# Patient Record
Sex: Male | Born: 1950
Health system: Southern US, Community
[De-identification: ages and names within clinical notes are randomized; demographics above are authoritative.]

## PROBLEM LIST (undated history)

## (undated) ENCOUNTER — Emergency Department (HOSPITAL_COMMUNITY): Admission: EM | Discharge status: Against Medical Advice | Payer: Self-pay

## (undated) DIAGNOSIS — I10 Essential (primary) hypertension: Secondary | ICD-10-CM

## (undated) HISTORY — DX: Essential (primary) hypertension: I10

---

## 2008-05-28 ENCOUNTER — Emergency Department: Payer: Self-pay | Admitting: Emergency Medicine

## 2011-03-14 ENCOUNTER — Emergency Department: Payer: Self-pay | Admitting: Unknown Physician Specialty

## 2014-03-11 ENCOUNTER — Emergency Department: Payer: Self-pay | Admitting: Emergency Medicine

## 2015-10-31 IMAGING — CT CT CERVICAL SPINE WITHOUT CONTRAST
3 of 4 series · 14 of 33 positions shown, 17 images · non-contrast
Comparison: None.

CLINICAL DATA: Fall.  Neck pain

EXAM:
CT CERVICAL SPINE WITHOUT CONTRAST
TECHNIQUE: Multidetector CT imaging of the cervical spine was performed without
intravenous contrast. Multiplanar CT image reconstructions were also
generated.

[Series 3: sag bone · sagittal · 0.31mm/px · 5 of 75 slices shown, 6 images]
[im 25/75  bone]
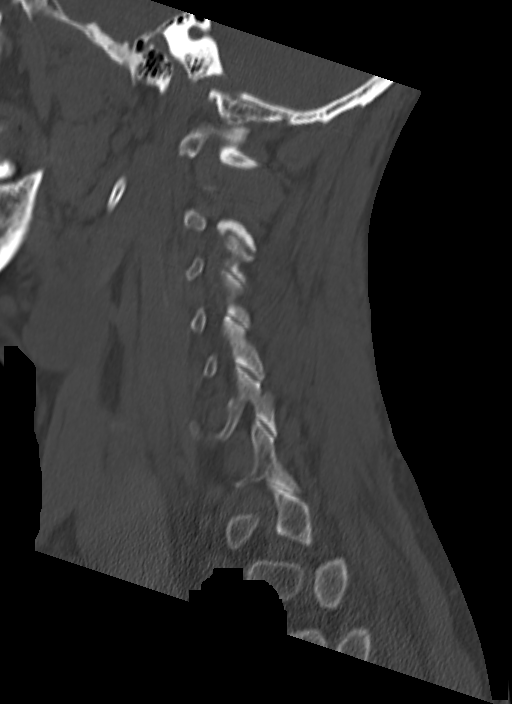
[im 31/75  bone]
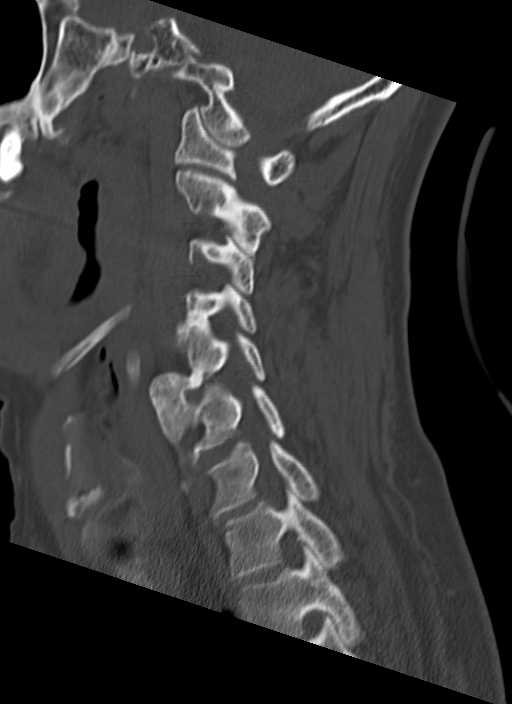
[im 38/75  soft-tissue]
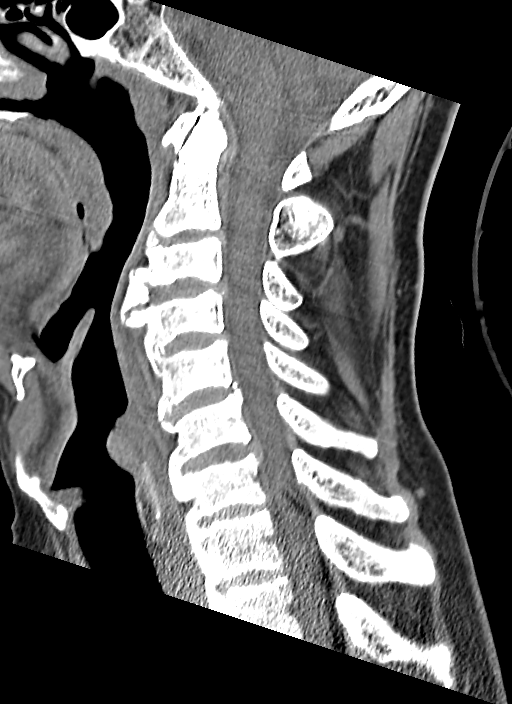
[im 38/75  bone]
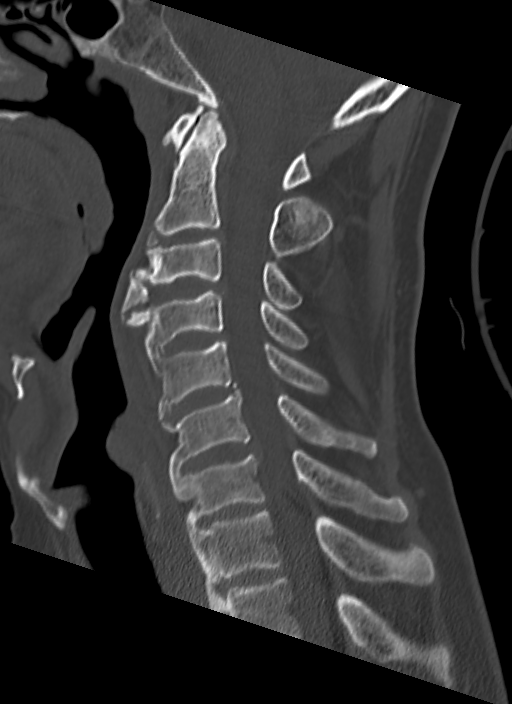
[im 44/75  bone]
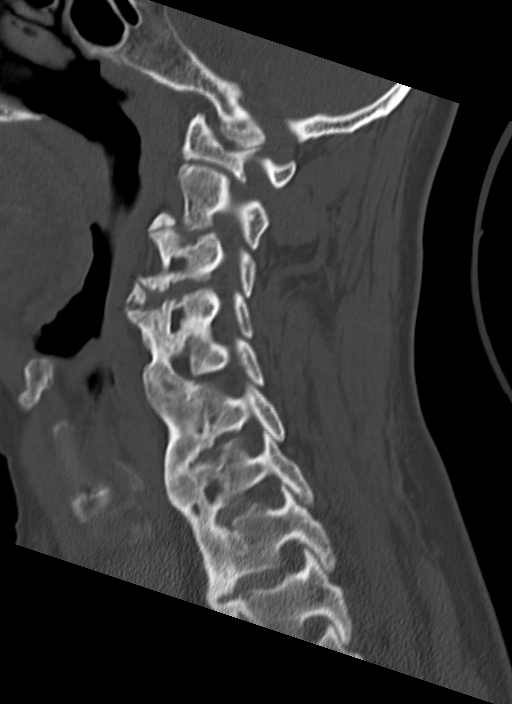
[im 50/75  bone]
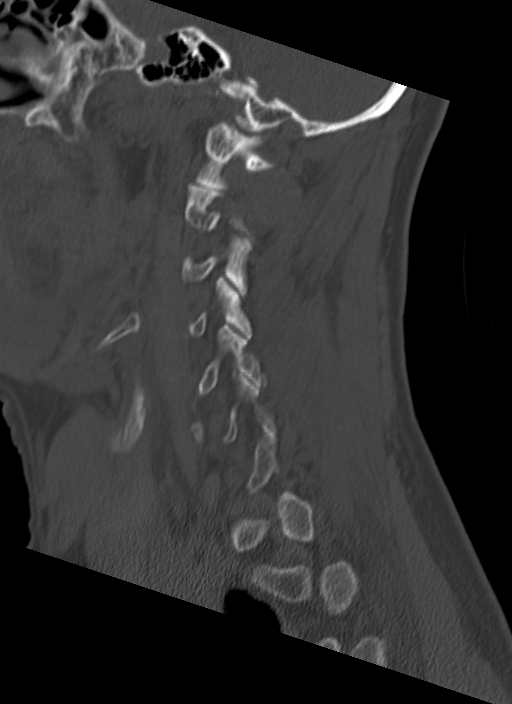

[Series 4: cor bone · coronal · 0.32mm/px · 3 of 68 slices shown]
[im 17/68  bone]
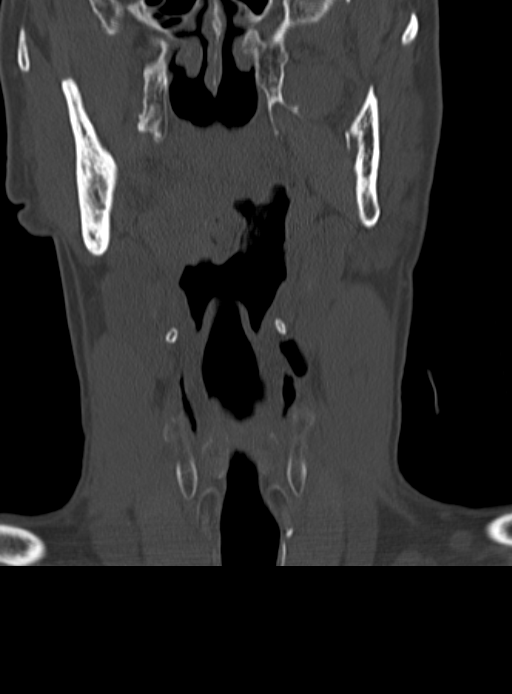
[im 28/68  bone]
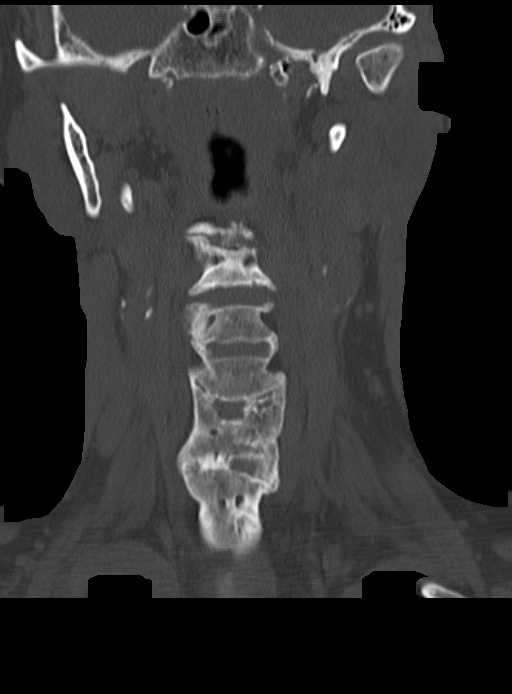
[im 40/68  bone]
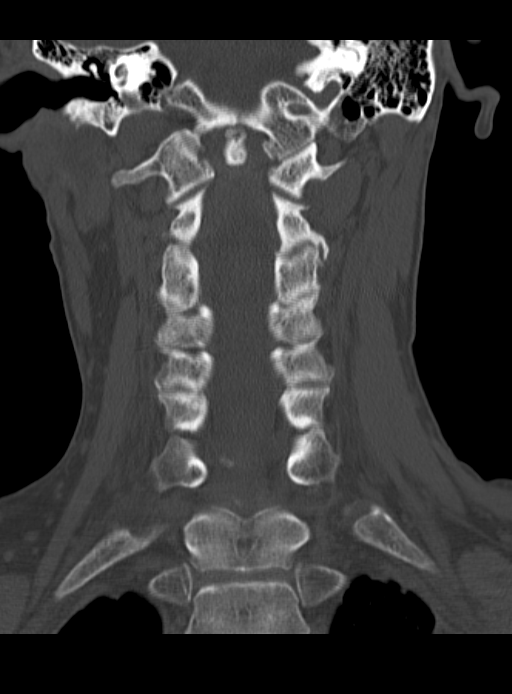

[Series 5: orthogonal axials · axial · 0.29mm/px · z∈[+196,+334]mm · 6 of 109 slices shown, 8 images]
[im 16/109  soft-tissue]
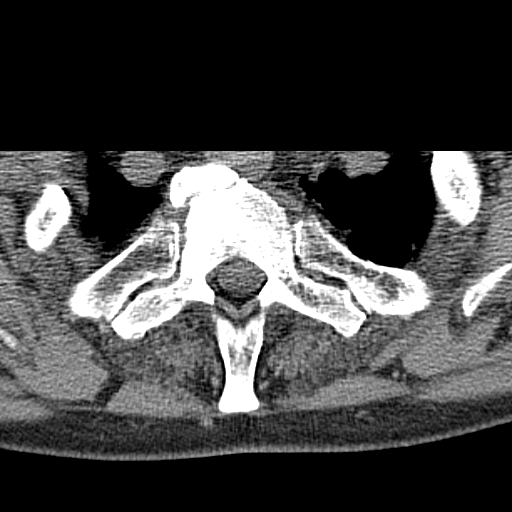
[im 16/109  bone]
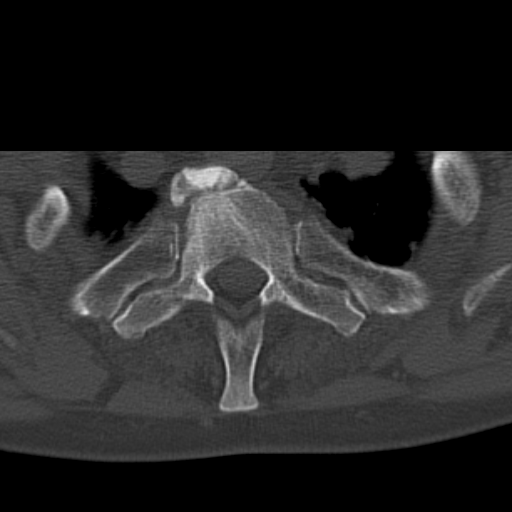
[im 31/109  bone]
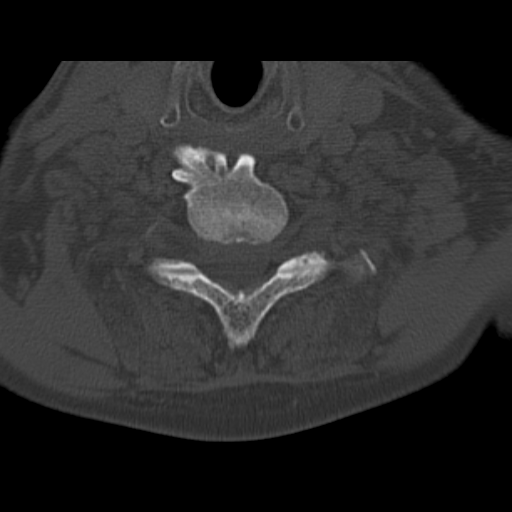
[im 47/109  bone]
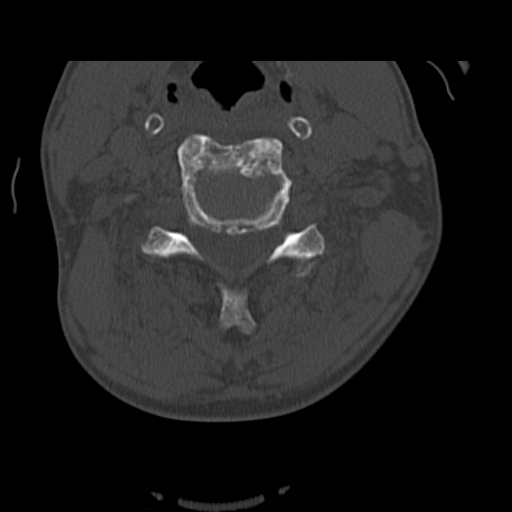
[im 62/109  bone]
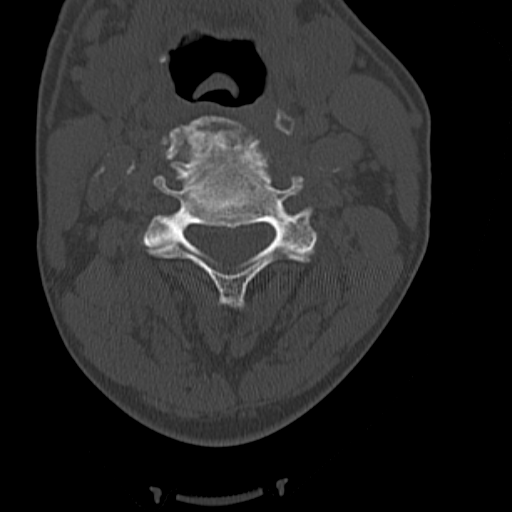
[im 78/109  soft-tissue]
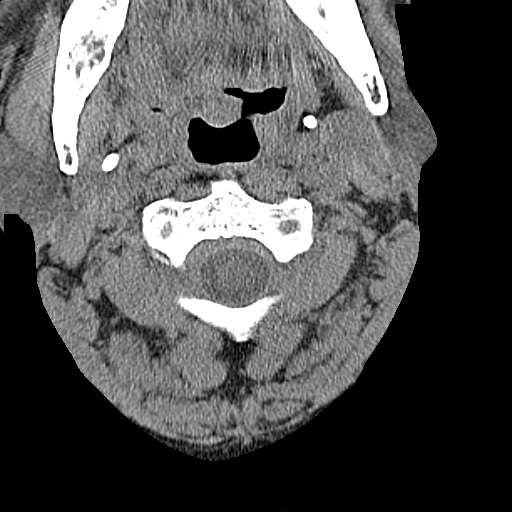
[im 78/109  bone]
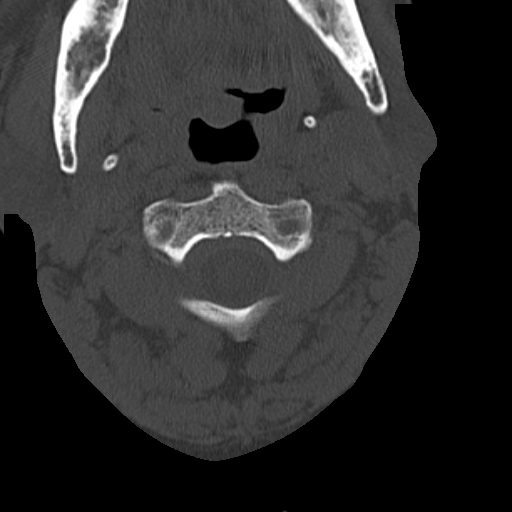
[im 93/109  bone]
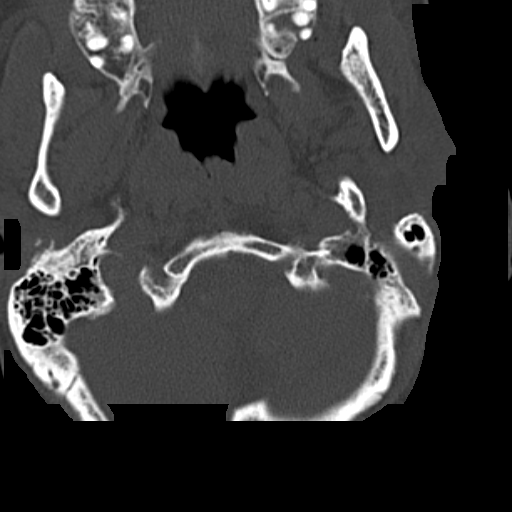

[14 of 33 positions shown; findings below may reference images not displayed]

FINDINGS: Normal cervical alignment. Negative for fracture. Mild disc
degeneration and spondylosis is present at C5-6 and C6-7. Prominent
anterior flowing osteophytes are present throughout the cervical
spine.

Negative for mass lesion.
IMPRESSION: Negative for fracture.

## 2018-01-13 ENCOUNTER — Encounter: Payer: Self-pay | Admitting: Family Medicine

## 2018-01-13 ENCOUNTER — Ambulatory Visit (INDEPENDENT_AMBULATORY_CARE_PROVIDER_SITE_OTHER): Payer: Medicare Other | Admitting: Family Medicine

## 2018-01-13 VITALS — BP 194/92 | HR 72 | Temp 98.0°F | Wt 160.4 lb

## 2018-01-13 DIAGNOSIS — I10 Essential (primary) hypertension: Secondary | ICD-10-CM | POA: Diagnosis not present

## 2018-01-13 DIAGNOSIS — Z7689 Persons encountering health services in other specified circumstances: Secondary | ICD-10-CM | POA: Diagnosis not present

## 2018-01-13 MED ORDER — AMLODIPINE BESYLATE 10 MG PO TABS: 10.0000 mg | ORAL_TABLET | Freq: Every day | ORAL | 0 refills | Status: DC

## 2018-01-13 NOTE — Progress Notes (Signed)
BP (!) 194/92   Pulse 72   Temp 98 F (36.7 C) (Oral)   Wt 160 lb 6.4 oz (72.8 kg)   SpO2 97%    Subjective:    Patient ID: Robert Larsen, male    DOB: 05-Jan-1951, 67 y.o.   MRN: [id number]  HPI: Robert Larsen is a 67 y.o. male  Chief Complaint  Patient presents with  . Establish Care  . Hypertension    Daughter states patient can't take lisinopril, all family members have had a severe reaction to the medication.  . Mass    Daugter states here's a knot on the left side of his chest.    Pt here today to establish care. No known medical problems other than hypertension. Was given BP medication in the hopsital several years ago which helped quite a bit. Either atenolol or norvasc couldn't remember. Ran out of the medicine and has not sought care since. Has headaches nearly every day and notes readings at home in the high 190s/high 90s-100s. Denies CP, SOB, dizziness, visual disturbances. Eats a healthy diet and stays active.   Does smoke cigarettes, about 2-3 cigarettes per day - not interested in quitting.   No other concerns today, not currently on any medications. Does not recall last CPE but states it's been years.   Past Medical History:  Diagnosis Date  . Hypertension    Social History   Socioeconomic History  . Marital status: Single    Spouse name: Not on file  . Number of children: Not on file  . Years of education: Not on file  . Highest education level: Not on file  Social Needs  . Financial resource strain: Not on file  . Food insecurity - worry: Not on file  . Food insecurity - inability: Not on file  . Transportation needs - medical: Not on file  . Transportation needs - non-medical: Not on file  Occupational History  . Not on file  Tobacco Use  . Smoking status: Current Every Day Smoker  . Smokeless tobacco: Never Used  Substance and Sexual Activity  . Alcohol use: Yes    Alcohol/week: 16.8 oz    Types: 28 Cans of beer per week    Comment: 4  cans beer/day  . Drug use: No  . Sexual activity: Not on file  Other Topics Concern  . Not on file  Social History Narrative  . Not on file    Relevant past medical, surgical, family and social history reviewed and updated as indicated. Interim medical history since our last visit reviewed. Allergies and medications reviewed and updated.  Review of Systems  Constitutional: Negative.   HENT: Negative.   Respiratory: Negative.   Cardiovascular: Negative.   Gastrointestinal: Negative.   Genitourinary: Negative.   Musculoskeletal: Negative.   Neurological: Positive for headaches.  Psychiatric/Behavioral: Negative.    Per HPI unless specifically indicated above     Objective:    BP (!) 194/92   Pulse 72   Temp 98 F (36.7 C) (Oral)   Wt 160 lb 6.4 oz (72.8 kg)   SpO2 97%   Wt Readings from Last 3 Encounters:  01/13/18 160 lb 6.4 oz (72.8 kg)    Physical Exam  Constitutional: He is oriented to person, place, and time. He appears well-developed and well-nourished. No distress.  HENT:  Head: Atraumatic.  Eyes: Conjunctivae are normal. Pupils are equal, round, and reactive to light.  Neck: Normal range of motion. Neck supple.  Cardiovascular: Normal  rate and normal heart sounds.  Pulmonary/Chest: Effort normal and breath sounds normal.  Musculoskeletal: Normal range of motion.  Neurological: He is alert and oriented to person, place, and time.  Skin: Skin is warm and dry.  Psychiatric: He has a normal mood and affect. His behavior is normal.  Nursing note and vitals reviewed.  No results found for this or any previous visit.    Assessment & Plan:   Problem List Items Addressed This Visit      Cardiovascular and Mediastinum   Essential hypertension - Primary    Will start 10 mg amlodipine and continue monitoring home BPs and logging readings/sxs. Will recheck in 1 month. Call with side effects or persistent abnormal readings in meantime.       Relevant Medications     amLODipine (NORVASC) 10 MG tablet    Other Visit Diagnoses    Encounter to establish care       Overdue for CPE and AWV, will schedule both during 1 month BP f/u       Follow up plan: Return in about 4 weeks (around 02/10/2018) for CPE, AWV, BP f/u.

## 2018-01-14 DIAGNOSIS — I10 Essential (primary) hypertension: Secondary | ICD-10-CM | POA: Insufficient documentation

## 2018-01-14 NOTE — Assessment & Plan Note (Signed)
Will start 10 mg amlodipine and continue monitoring home BPs and logging readings/sxs. Will recheck in 1 month. Call with side effects or persistent abnormal readings in meantime.

## 2018-01-14 NOTE — Patient Instructions (Signed)
Follow up in 1 month   

## 2018-01-15 ENCOUNTER — Ambulatory Visit: Payer: Self-pay | Admitting: *Deleted

## 2018-01-15 NOTE — Telephone Encounter (Signed)
Pt's daughter reports pt seen 01/13/18 for HTN, placed on Norvasc 10 mg which he has taken each am. Daughter reports B/P Monday 180/80  HR 77.Marland Kitchen.Marland Kitchen.Marland Kitchen.Tues 160/85  HR  79;  This afternoon B/P  left arm... 177/100  HR 90 , right arm (manually checked) 185/89   HR 85.  Pt reports saw "a couple spots" this afternoon. Denies headache, dizziness, no present visual changes. Pt's daughter reports he has been working nights which may contribute to pressure. Pt resting presently.Pt's daughter is a Engineer, civil (consulting)nurse, asking if dose should be increased and if so could that be called in to pharmacy. Note routed to PCP.   Reason for Disposition . Systolic BP  >= 180 OR Diastolic >= 110  Answer Assessment - Initial Assessment Questions 1. BLOOD PRESSURE: "What is the blood pressure?" "Did you take at least two measurements 5 minutes apart?"     Yes, by daughter who is a Engineer, civil (consulting)nurse. 177/100 left arm with home monitor HR     90      Manually 185/89 HR 85 2. ONSET: "When did you take your blood pressure?"     "10 minutes ago" 3. HOW: "How did you obtain the blood pressure?" (e.g., visiting nurse, automatic home BP monitor)     Both manually and monitor 4. HISTORY: "Do you have a history of high blood pressure?"     yes 5. MEDICATIONS: "Are you taking any medications for blood pressure?" "Have you missed any doses recently?"     Yes, no missed doses 6. OTHER SYMPTOMS: "Do you have any symptoms?" (e.g., headache, chest pain, blurred vision, difficulty breathing, weakness)     Reports saw " A couple spots this afternoon." Denies headache, dizziness, chest pain. No present visual changes.  Protocols used: HIGH BLOOD PRESSURE-A-AH

## 2018-01-16 NOTE — Telephone Encounter (Signed)
Patient notified

## 2018-01-16 NOTE — Telephone Encounter (Signed)
He only started the new medicine 2-3 days ago and we need to give it a bit of time to get into his system. I also don't want to drop his blood pressures too low too fast as he's been in the 190s/100 range for years now. He's got a follow up scheduled, we can re-assess at this point.

## 2018-01-20 ENCOUNTER — Ambulatory Visit: Payer: Self-pay

## 2018-01-20 MED ORDER — HYDROCHLOROTHIAZIDE 25 MG PO TABS: 25.0000 mg | ORAL_TABLET | Freq: Every day | ORAL | 0 refills | Status: DC

## 2018-01-20 NOTE — Telephone Encounter (Signed)
Pt's daughter (who is an Charity fundraiserN) calling to report elevated BP's with her father. Pt 's BP 199/90 and 180/90 HR 72. Daughter states father is c/o weakness. States he is not having weakness or numbness of the face, arm or leg on one side of the body. No arm drifting. No spots or blurred vision. Pt was started on amlodipine 01/13/18 and daughter has been logging BP's. Care advice given and appt made with PCP for tomorrow at 0930.  Reason for Disposition . Systolic BP  >= 180 OR Diastolic >= 110  Answer Assessment - Initial Assessment Questions 1. BLOOD PRESSURE: "What is the blood pressure?" "Did you take at least two measurements 5 minutes apart?"     199/93 and 180/90 HR 72 2. ONSET: "When did you take your blood pressure?"     1525 3. HOW: "How did you obtain the blood pressure?" (e.g., visiting nurse, automatic home BP monitor)     Manual BP cuff 4. HISTORY: "Do you have a history of high blood pressure?"     Yes  5. MEDICATIONS: "Are you taking any medications for blood pressure?" "Have you missed any doses recently?"     Yes new med that was 01/13/17 has not missed any doses 6. OTHER SYMPTOMS: "Do you have any symptoms?" (e.g., headache, chest pain, blurred vision, difficulty breathing, weakness)     Weakness feels tired- no neuro sx on either side of body no headache spots or blurred 7. PREGNANCY: "Is there any chance you are pregnant?" "When was your last menstrual period?"     n/a  Protocols used: HIGH BLOOD PRESSURE-A-AH

## 2018-01-20 NOTE — Telephone Encounter (Signed)
Called pt, who corroborated that his BPs have been still significantly elevated despite daily amlodipine 10 mg. Agreeable to adding 25 mg HCTZ and coming in for an OV in 1-2 weeks to check on things. Discussed possible side effects and to drink lots of fluids. Pt will start medication tomorrow.   Copied from CRM [phone]#[id number]. Topic: Inquiry >> Jan 20, 2018  3:21 PM Alexander BergeronBarksdale, Harvey B wrote: Reason for CRM: pt's child called and states the pt's blood pressure is highly elevated and is seeking guidance Left 218/96 Right 199/93

## 2018-01-21 ENCOUNTER — Ambulatory Visit: Payer: Medicare Other | Admitting: Family Medicine

## 2018-02-10 ENCOUNTER — Ambulatory Visit (INDEPENDENT_AMBULATORY_CARE_PROVIDER_SITE_OTHER): Payer: Medicare Other

## 2018-02-10 ENCOUNTER — Ambulatory Visit (INDEPENDENT_AMBULATORY_CARE_PROVIDER_SITE_OTHER): Payer: Medicare Other | Admitting: Family Medicine

## 2018-02-10 VITALS — BP 168/79 | HR 67 | Temp 97.7°F | Resp 16 | Ht 69.5 in | Wt 154.1 lb

## 2018-02-10 VITALS — BP 138/60 | HR 67 | Temp 97.7°F | Resp 16 | Ht 69.5 in | Wt 154.1 lb

## 2018-02-10 DIAGNOSIS — Z1159 Encounter for screening for other viral diseases: Secondary | ICD-10-CM

## 2018-02-10 DIAGNOSIS — Z0001 Encounter for general adult medical examination with abnormal findings: Secondary | ICD-10-CM

## 2018-02-10 DIAGNOSIS — A599 Trichomoniasis, unspecified: Secondary | ICD-10-CM

## 2018-02-10 DIAGNOSIS — Z0000 Encounter for general adult medical examination without abnormal findings: Secondary | ICD-10-CM | POA: Diagnosis not present

## 2018-02-10 DIAGNOSIS — I10 Essential (primary) hypertension: Secondary | ICD-10-CM

## 2018-02-10 LAB — URINALYSIS, ROUTINE W REFLEX MICROSCOPIC
Bilirubin, UA: NEGATIVE
Glucose, UA: NEGATIVE
Ketones, UA: NEGATIVE
NITRITE UA: NEGATIVE
PH UA: 7 (ref 5.0–7.5)
Protein, UA: NEGATIVE
RBC, UA: NEGATIVE
Specific Gravity, UA: 1.01 (ref 1.005–1.030)
UUROB: 0.2 mg/dL (ref 0.2–1.0)

## 2018-02-10 LAB — MICROSCOPIC EXAMINATION: Bacteria, UA: NONE SEEN

## 2018-02-10 MED ORDER — METOPROLOL SUCCINATE ER 50 MG PO TB24: 50.0000 mg | ORAL_TABLET | Freq: Every day | ORAL | 0 refills | Status: DC

## 2018-02-10 MED ORDER — METRONIDAZOLE 500 MG PO TABS: 500.0000 mg | ORAL_TABLET | Freq: Two times a day (BID) | ORAL | 0 refills | Status: DC

## 2018-02-10 MED ORDER — AMLODIPINE BESYLATE 10 MG PO TABS: 10.0000 mg | ORAL_TABLET | Freq: Every day | ORAL | 1 refills | Status: DC

## 2018-02-10 NOTE — Patient Instructions (Addendum)
Robert Larsen , Thank you for taking time to come for your Medicare Wellness Visit. I appreciate your ongoing commitment to your health goals. Please review the following plan we discussed and let me know if I can assist you in the future.   Screening recommendations/referrals: Colonoscopy: due now- declined Recommended yearly ophthalmology/optometry visit for glaucoma screening and checkup Recommended yearly dental visit for hygiene and checkup  Vaccinations: Influenza vaccine: due- declined Pneumococcal vaccine: due- declined Tdap vaccine: due-declined Shingles vaccine: due-declined  Advanced directives: Advance directive discussed with you today. I have provided a copy for you to complete at home and have notarized. Once this is complete please bring a copy in to our office so we can scan it into your chart.  Conditions/risks identified: Smoking cessation discussed  Next appointment: Follow up in one year for your annual wellness exam.   Preventive Care 65 Years and Older, Male Preventive care refers to lifestyle choices and visits with your health care provider that can promote health and wellness. What does preventive care include?  A yearly physical exam. This is also called an annual well check.  Dental exams once or twice a year.  Routine eye exams. Ask your health care provider how often you should have your eyes checked.  Personal lifestyle choices, including:  Daily care of your teeth and gums.  Regular physical activity.  Eating a healthy diet.  Avoiding tobacco and drug use.  Limiting alcohol use.  Practicing safe sex.  Taking low doses of aspirin every day.  Taking vitamin and mineral supplements as recommended by your health care provider. What happens during an annual well check? The services and screenings done by your health care provider during your annual well check will depend on your age, overall health, lifestyle risk factors, and family history of  disease. Counseling  Your health care provider may ask you questions about your:  Alcohol use.  Tobacco use.  Drug use.  Emotional well-being.  Home and relationship well-being.  Sexual activity.  Eating habits.  History of falls.  Memory and ability to understand (cognition).  Work and work Astronomer. Screening  You may have the following tests or measurements:  Height, weight, and BMI.  Blood pressure.  Lipid and cholesterol levels. These may be checked every 5 years, or more frequently if you are over 6 years old.  Skin check.  Lung cancer screening. You may have this screening every year starting at age 55 if you have a 30-pack-year history of smoking and currently smoke or have quit within the past 15 years.  Fecal occult blood test (FOBT) of the stool. You may have this test every year starting at age 1.  Flexible sigmoidoscopy or colonoscopy. You may have a sigmoidoscopy every 5 years or a colonoscopy every 10 years starting at age 40.  Prostate cancer screening. Recommendations will vary depending on your family history and other risks.  Hepatitis C blood test.  Hepatitis B blood test.  Sexually transmitted disease (STD) testing.  Diabetes screening. This is done by checking your blood sugar (glucose) after you have not eaten for a while (fasting). You may have this done every 1-3 years.  Abdominal aortic aneurysm (AAA) screening. You may need this if you are a current or former smoker.  Osteoporosis. You may be screened starting at age 99 if you are at high risk. Talk with your health care provider about your test results, treatment options, and if necessary, the need for more tests. Vaccines  Your  health care provider may recommend certain vaccines, such as:  Influenza vaccine. This is recommended every year.  Tetanus, diphtheria, and acellular pertussis (Tdap, Td) vaccine. You may need a Td booster every 10 years.  Zoster vaccine. You may  need this after age 61.  Pneumococcal 13-valent conjugate (PCV13) vaccine. One dose is recommended after age 47.  Pneumococcal polysaccharide (PPSV23) vaccine. One dose is recommended after age 73. Talk to your health care provider about which screenings and vaccines you need and how often you need them. This information is not intended to replace advice given to you by your health care provider. Make sure you discuss any questions you have with your health care provider. Document Released: 12/23/2015 Document Revised: 08/15/2016 Document Reviewed: 09/27/2015 Elsevier Interactive Patient Education  2017 [place] Prevention in the Home Falls can cause injuries. They can happen to people of all ages. There are many things you can do to make your home safe and to help prevent falls. What can I do on the outside of my home?  Regularly fix the edges of walkways and driveways and fix any cracks.  Remove anything that might make you trip as you walk through a door, such as a raised step or threshold.  Trim any bushes or trees on the path to your home.  Use bright outdoor lighting.  Clear any walking paths of anything that might make someone trip, such as rocks or tools.  Regularly check to see if handrails are loose or broken. Make sure that both sides of any steps have handrails.  Any raised decks and porches should have guardrails on the edges.  Have any leaves, snow, or ice cleared regularly.  Use sand or salt on walking paths during winter.  Clean up any spills in your garage right away. This includes oil or grease spills. What can I do in the bathroom?  Use night lights.  Install grab bars by the toilet and in the tub and shower. Do not use towel bars as grab bars.  Use non-skid mats or decals in the tub or shower.  If you need to sit down in the shower, use a plastic, non-slip stool.  Keep the floor dry. Clean up any water that spills on the floor as soon as it  happens.  Remove soap buildup in the tub or shower regularly.  Attach bath mats securely with double-sided non-slip rug tape.  Do not have throw rugs and other things on the floor that can make you trip. What can I do in the bedroom?  Use night lights.  Make sure that you have a light by your bed that is easy to reach.  Do not use any sheets or blankets that are too big for your bed. They should not hang down onto the floor.  Have a firm chair that has side arms. You can use this for support while you get dressed.  Do not have throw rugs and other things on the floor that can make you trip. What can I do in the kitchen?  Clean up any spills right away.  Avoid walking on wet floors.  Keep items that you use a lot in easy-to-reach places.  If you need to reach something above you, use a strong step stool that has a grab bar.  Keep electrical cords out of the way.  Do not use floor polish or wax that makes floors slippery. If you must use wax, use non-skid floor wax.  Do not  have throw rugs and other things on the floor that can make you trip. What can I do with my stairs?  Do not leave any items on the stairs.  Make sure that there are handrails on both sides of the stairs and use them. Fix handrails that are broken or loose. Make sure that handrails are as long as the stairways.  Check any carpeting to make sure that it is firmly attached to the stairs. Fix any carpet that is loose or worn.  Avoid having throw rugs at the top or bottom of the stairs. If you do have throw rugs, attach them to the floor with carpet tape.  Make sure that you have a light switch at the top of the stairs and the bottom of the stairs. If you do not have them, ask someone to add them for you. What else can I do to help prevent falls?  Wear shoes that:  Do not have high heels.  Have rubber bottoms.  Are comfortable and fit you well.  Are closed at the toe. Do not wear sandals.  If you  use a stepladder:  Make sure that it is fully opened. Do not climb a closed stepladder.  Make sure that both sides of the stepladder are locked into place.  Ask someone to hold it for you, if possible.  Clearly mark and make sure that you can see:  Any grab bars or handrails.  First and last steps.  Where the edge of each step is.  Use tools that help you move around (mobility aids) if they are needed. These include:  Canes.  Walkers.  Scooters.  Crutches.  Turn on the lights when you go into a dark area. Replace any light bulbs as soon as they burn out.  Set up your furniture so you have a clear path. Avoid moving your furniture around.  If any of your floors are uneven, fix them.  If there are any pets around you, be aware of where they are.  Review your medicines with your doctor. Some medicines can make you feel dizzy. This can increase your chance of falling. Ask your doctor what other things that you can do to help prevent falls. This information is not intended to replace advice given to you by your health care provider. Make sure you discuss any questions you have with your health care provider. Document Released: 09/22/2009 Document Revised: 05/03/2016 Document Reviewed: 12/31/2014 Elsevier Interactive Patient Education  2017 [employer]  Your provider would like to you have your annual eye exam. Please contact your current eye doctor or here are some good options for you to contact.   [facility]        [facility] Address: [address], [place], [place] [address]   Address: [address], [place], [place] [address]  Phone: [phone]      Phone: [phone]  Website: [email or web address]    Website: [email or web address]     [facility]       [facility]  Address: [address], [place], [place] [address]   Address: [address], [place], [place] [address] Phone: [phone]      Phone: [phone]    [facility] Address: [address], [place], [place] [address]  Phone: [phone]  Steps to Quit Smoking Smoking tobacco can be bad for your health. It can also affect almost every organ in your body. Smoking puts you and  people around you at risk for many serious long-lasting (chronic) diseases. Quitting smoking is hard, but it is one of the best things that you can do for your health. It is never too late to quit. What are the benefits of quitting smoking? When you quit smoking, you lower your risk for getting serious diseases and conditions. They can include:  Lung cancer or lung disease.  Heart disease.  Stroke.  Heart attack.  Not being able to have children (infertility).  Weak bones (osteoporosis) and broken bones (fractures).  If you have coughing, wheezing, and shortness of breath, those symptoms may get better when you quit. You may also get sick less often. If you are pregnant, quitting smoking can help to lower your chances of having a baby of low birth weight. What can I do to help me quit smoking? Talk with your doctor about what can help you quit smoking. Some things you can do (strategies) include:  Quitting smoking totally, instead of slowly cutting back how much you smoke over a period of time.  Going to in-person counseling. You are more likely to quit if you go to many counseling sessions.  Using resources and support systems, such as: ? Agricultural engineer with a Veterinary surgeon. ? Phone quitlines. ? Automotive engineer. ? Support groups or group counseling. ? Text messaging programs. ? Mobile phone apps or applications.  Taking medicines. Some of these medicines may have nicotine in them. If you are pregnant or breastfeeding, do not take any medicines to quit smoking unless your doctor says it is okay. Talk with your doctor about counseling or other things that can help you.  Talk with your doctor about using more than one strategy at the same time, such as taking  medicines while you are also going to in-person counseling. This can help make quitting easier. What things can I do to make it easier to quit? Quitting smoking might feel very hard at first, but there is a lot that you can do to make it easier. Take these steps:  Talk to your family and friends. Ask them to support and encourage you.  Call phone quitlines, reach out to support groups, or work with a Veterinary surgeon.  Ask people who smoke to not smoke around you.  Avoid places that make you want (trigger) to smoke, such as: ? Bars. ? Parties. ? Smoke-break areas at work.  Spend time with people who do not smoke.  Lower the stress in your life. Stress can make you want to smoke. Try these things to help your stress: ? Getting regular exercise. ? Deep-breathing exercises. ? Yoga. ? Meditating. ? Doing a body scan. To do this, close your eyes, focus on one area of your body at a time from head to toe, and notice which parts of your body are tense. Try to relax the muscles in those areas.  Download or buy apps on your mobile phone or tablet that can help you stick to your quit plan. There are many free apps, such as QuitGuide from the [employer] developer for Disease Control and Prevention). You can find more support from smokefree.gov and other websites.  This information is not intended to replace advice given to you by your health care provider. Make sure you discuss any questions you have with your health care provider. Document Released: 09/22/2009 Document Revised: 07/24/2016 Document Reviewed: 04/12/2015 Elsevier Interactive Patient Education  2018 [employer].

## 2018-02-10 NOTE — Progress Notes (Signed)
Subjective:   Robert Larsen is a 67 y.o. male who presents for an Initial Medicare Annual Wellness Visit.  Review of Systems   Cardiac Risk Factors include: hypertension;male gender;advanced age (>3mn, >>90women);smoking/ tobacco exposure    Objective:    Today's Vitals   02/10/18 0847  BP: (!) 168/79  Pulse: 67  Resp: 16  Temp: 97.7 F (36.5 C)  TempSrc: Temporal  SpO2: 98%  Weight: 154 lb 1.6 oz (69.9 kg)  Height: 5' 9.5" (1.765 m)   Body mass index is 22.43 kg/m.  Advanced Directives 02/10/2018  Does Patient Have a Medical Advance Directive? Yes  Does patient want to make changes to medical advance directive? Yes (MAU/Ambulatory/Procedural Areas - Information given)    Current Medications (verified) Outpatient Encounter Medications as of 02/10/2018  Medication Sig  . amLODipine (NORVASC) 10 MG tablet Take 1 tablet (10 mg total) by mouth daily.  . hydrochlorothiazide (HYDRODIURIL) 25 MG tablet Take 1 tablet (25 mg total) by mouth daily.   No facility-administered encounter medications on file as of 02/10/2018.     Allergies (verified) Lisinopril   History: Past Medical History:  Diagnosis Date  . Hypertension    History reviewed. No pertinent surgical history. Family History  Problem Relation Age of Onset  . Hypertension Mother   . Cancer Mother        Breast, HER2  . Osteoporosis Mother   . Stroke Father   . Heart attack Father   . Hypertension Daughter   . Diabetes Maternal Aunt   . Hypertension Maternal Aunt   . Cancer Maternal Uncle   . Heart attack Paternal Grandmother   . Heart attack Paternal Grandfather   . Hypertension Other   . Heart attack Other   . Congestive Heart Failure Other    Social History   Socioeconomic History  . Marital status: Single    Spouse name: None  . Number of children: None  . Years of education: None  . Highest education level: None  Social Needs  . Financial resource strain: Not hard at all  . Food  insecurity - worry: Never true  . Food insecurity - inability: Never true  . Transportation needs - medical: No  . Transportation needs - non-medical: No  Occupational History  . None  Tobacco Use  . Smoking status: Current Every Day Smoker    Packs/day: 0.25    Types: Cigarettes  . Smokeless tobacco: Never Used  . Tobacco comment: 1 pack a week   Substance and Sexual Activity  . Alcohol use: Yes    Alcohol/week: 16.8 oz    Types: 28 Cans of beer per week    Comment: 4 cans beer/day  . Drug use: No  . Sexual activity: None  Other Topics Concern  . None  Social History Narrative  . None   Tobacco Counseling Ready to quit: No Counseling given: Yes Comment: 1 pack a week    Clinical Intake:  Pre-visit preparation completed: Yes  Pain : No/denies pain     Nutritional Status: BMI of 19-24  Normal Nutritional Risks: Unintentional weight loss(lost appetitie when starting BP medication ) Diabetes: No  How often do you need to have someone help you when you read instructions, pamphlets, or other written materials from your doctor or pharmacy?: 1 - Never What is the last grade level you completed in school?: 12th grade  Interpreter Needed?: No  Information entered by :: Tiffany Hill,LPN   Activities of Daily Living In  your present state of health, do you have any difficulty performing the following activities: 02/10/2018  Hearing? N  Vision? Y  Difficulty concentrating or making decisions? N  Walking or climbing stairs? N  Dressing or bathing? N  Doing errands, shopping? N  Preparing Food and eating ? N  Using the Toilet? N  In the past six months, have you accidently leaked urine? N  Do you have problems with loss of bowel control? N  Managing your Medications? N  Managing your Finances? N  Housekeeping or managing your Housekeeping? N  Some recent data might be hidden     Immunizations and Health Maintenance  There is no immunization history on file for this  patient. There are no preventive care reminders to display for this patient.  Patient Care Team: Volney American, PA-C as PCP - General (Family Medicine)  Indicate any recent Medical Services you may have received from other than [facility] providers in the past year (date may be approximate).    Assessment:   This is a routine wellness examination for Achilles.  Hearing/Vision screen Vision Screening Comments: Patient does not have an eye doctor, will give local recommendations  Dietary issues and exercise activities discussed: Current Exercise Habits: The patient does not participate in regular exercise at present, Exercise limited by: None identified  Goals    . Quit Smoking     Smoking cessation discussed      Depression Screen PHQ 2/9 Scores 02/10/2018  PHQ - 2 Score 0  PHQ- 9 Score 3    Fall Risk Fall Risk  02/10/2018  Falls in the past year? No    Is the patient's home free of loose throw rugs in walkways, pet beds, electrical cords, etc?   yes      Grab bars in the bathroom? no      Handrails on the stairs?   no, no stairs in home       Adequate lighting?   yes  Timed Get Up and Go performed: Completed in 8 seconds with no use of assistive devices, steady gait. No intervention needed at this time.   Cognitive Function:     6CIT Screen 02/10/2018  What Year? 0 points  What month? 0 points  What time? 0 points  Count back from 20 0 points  Months in reverse 0 points  Repeat phrase 0 points  Total Score 0    Screening Tests Health Maintenance  Topic Date Due  . INFLUENZA VACCINE  09/15/2018 (Originally 07/10/2017)  . TETANUS/TDAP  01/13/2019 (Originally 10/01/1970)  . PNA vac Low Risk Adult (1 of 2 - PCV13) 01/13/2019 (Originally 10/01/2016)  . COLONOSCOPY  02/11/2019 (Originally 10/01/2001)  . Hepatitis C Screening  Completed    Declined all vaccines and screenings.    Qualifies for Shingles Vaccine? Yes, declined  Cancer Screenings: Lung: Low Dose CT  Chest recommended if Age 42-80 years, 30 pack-year currently smoking OR have quit w/in 15years. Patient does qualify. Declined screening.  Colorectal: colonoscopy declined  Additional Screenings:  Hepatitis B/HIV/Syphillis: not indicated  Hepatitis C Screening: Hep c done today       Plan:    I have personally reviewed and addressed the Medicare Annual Wellness questionnaire and have noted the following in the patient's chart:  A. Medical and social history B. Use of alcohol, tobacco or illicit drugs  C. Current medications and supplements D. Functional ability and status E.  Nutritional status F.  Physical activity G. Advance directives H.  List of other physicians I.  Hospitalizations, surgeries, and ER visits in previous 12 months J.  [place] such as hearing and vision if needed, cognitive and depression L. Referrals and appointments   In addition, I have reviewed and discussed with patient certain preventive protocols, quality metrics, and best practice recommendations. A written personalized care plan for preventive services as well as general preventive health recommendations were provided to patient.   Signed,  Tyler Aas, LPN Nurse Health Advisor   Nurse Notes: none

## 2018-02-10 NOTE — Progress Notes (Signed)
BP 138/60   Pulse 67   Temp 97.7 F (36.5 C) (Temporal)   Resp 16   Ht 5' 9.5" (1.765 m)   Wt 154 lb 1.6 oz (69.9 kg)   BMI 22.43 kg/m    Subjective:    Patient ID: Leonell Lobdell, male    DOB: 1950-12-19, 67 y.o.   MRN: [id number]  HPI: Roma Bierlein is a 67 y.o. male presenting on 02/10/2018 for comprehensive medical examination. Current medical complaints include:see below  Home BPs hav been 150s/90s - low 200s/100s. Denies CP, SOB, dizziness, visual changes, syncope. Taking medications faithfully. Has had a noticeable appetite change since starting HCTZ a few weeks ago, thinks it may be related though he's also been under a lot of stress in that time.   Depression Screen done today and results listed below:  Depression screen [facility] 2/9 02/10/2018  Decreased Interest 0  Down, Depressed, Hopeless 0  PHQ - 2 Score 0  Altered sleeping 0  Tired, decreased energy 1  Change in appetite 2  Feeling bad or failure about yourself  0  Trouble concentrating 0  Moving slowly or fidgety/restless 0  Suicidal thoughts 0  PHQ-9 Score 3  Difficult doing work/chores Not difficult at all    The patient does not have a history of falls. I did not complete a risk assessment for falls. A plan of care for falls was not documented.   Past Medical History:  Past Medical History:  Diagnosis Date  . Hypertension     Surgical History:  No past surgical history on file.  Medications:  Current Outpatient Medications on File Prior to Visit  Medication Sig  . hydrochlorothiazide (HYDRODIURIL) 25 MG tablet Take 1 tablet (25 mg total) by mouth daily.   No current facility-administered medications on file prior to visit.     Allergies:  Allergies  Allergen Reactions  . Lisinopril Other (See Comments)    Refuses to take as mutliple family members have had facial swelling and breathing issues on it    Social History:  Social History   Socioeconomic History  . Marital status: Single   Spouse name: Not on file  . Number of children: Not on file  . Years of education: Not on file  . Highest education level: Not on file  Social Needs  . Financial resource strain: Not hard at all  . Food insecurity - worry: Never true  . Food insecurity - inability: Never true  . Transportation needs - medical: No  . Transportation needs - non-medical: No  Occupational History  . Not on file  Tobacco Use  . Smoking status: Current Every Day Smoker    Packs/day: 0.25    Types: Cigarettes  . Smokeless tobacco: Never Used  . Tobacco comment: 1 pack a week   Substance and Sexual Activity  . Alcohol use: Yes    Alcohol/week: 16.8 oz    Types: 28 Cans of beer per week    Comment: 4 cans beer/day  . Drug use: No  . Sexual activity: Not on file  Other Topics Concern  . Not on file  Social History Narrative  . Not on file   Social History   Tobacco Use  Smoking Status Current Every Day Smoker  . Packs/day: 0.25  . Types: Cigarettes  Smokeless Tobacco Never Used  Tobacco Comment   1 pack a week    Social History   Substance and Sexual Activity  Alcohol Use Yes  . Alcohol/week: 16.8  oz  . Types: 28 Cans of beer per week   Comment: 4 cans beer/day    Family History:  Family History  Problem Relation Age of Onset  . Hypertension Mother   . Cancer Mother        Breast, HER2  . Osteoporosis Mother   . Stroke Father   . Heart attack Father   . Hypertension Daughter   . Diabetes Maternal Aunt   . Hypertension Maternal Aunt   . Cancer Maternal Uncle   . Heart attack Paternal Grandmother   . Heart attack Paternal Grandfather   . Hypertension Other   . Heart attack Other   . Congestive Heart Failure Other     Past medical history, surgical history, medications, allergies, family history and social history reviewed with patient today and changes made to appropriate areas of the chart.   Review of Systems - General ROS: negative Psychological ROS: negative Ophthalmic  ROS: negative ENT ROS: negative Breast ROS: negative for breast lumps Respiratory ROS: no cough, shortness of breath, or wheezing Cardiovascular ROS: no chest pain or dyspnea on exertion Gastrointestinal ROS: no abdominal pain, change in bowel habits, or black or bloody stools Genito-Urinary ROS: no dysuria, trouble voiding, or hematuria Musculoskeletal ROS: negative Neurological ROS: no TIA or stroke symptoms Dermatological ROS: negative All other ROS negative except what is listed above and in the HPI.      Objective:    BP 138/60   Pulse 67   Temp 97.7 F (36.5 C) (Temporal)   Resp 16   Ht 5' 9.5" (1.765 m)   Wt 154 lb 1.6 oz (69.9 kg)   BMI 22.43 kg/m   Wt Readings from Last 3 Encounters:  02/10/18 154 lb 1.6 oz (69.9 kg)  02/10/18 154 lb 1.6 oz (69.9 kg)  01/13/18 160 lb 6.4 oz (72.8 kg)    Physical Exam  Constitutional: He is oriented to person, place, and time. He appears well-developed and well-nourished. No distress.  HENT:  Head: Atraumatic.  Right Ear: External ear normal.  Left Ear: External ear normal.  Nose: Nose normal.  Mouth/Throat: Oropharynx is clear and moist.  Eyes: Conjunctivae are normal. Pupils are equal, round, and reactive to light. No scleral icterus.  Neck: Normal range of motion. Neck supple.  Cardiovascular: Normal rate, regular rhythm, normal heart sounds and intact distal pulses.  No murmur heard. Pulmonary/Chest: Effort normal and breath sounds normal. No respiratory distress.  Abdominal: Soft. Bowel sounds are normal. He exhibits no distension and no mass. There is no tenderness. There is no guarding.  Genitourinary:  Genitourinary Comments: Declines GU exam  Musculoskeletal: Normal range of motion. He exhibits no edema or tenderness.  Neurological: He is alert and oriented to person, place, and time. He has normal reflexes.  Skin: Skin is warm and dry. No rash noted.  Psychiatric: He has a normal mood and affect. His behavior is  normal.  Nursing note and vitals reviewed.   Results for orders placed or performed in visit on 02/10/18  Microscopic Examination  Result Value Ref Range   WBC, UA 0-5 0 - 5 /hpf   RBC, UA 0-2 0 - 2 /hpf   Epithelial Cells (non renal) 0-10 0 - 10 /hpf   Bacteria, UA None seen None seen/Few   Trichomonas, UA Present None seen  Comp Met (CMET)  Result Value Ref Range   Glucose 84 65 - 99 mg/dL   BUN 11 8 - 27 mg/dL   Creatinine, Ser  1.14 0.76 - 1.27 mg/dL   GFR calc non Af Amer 67 >59 mL/min/1.73   GFR calc Af Amer 77 >59 mL/min/1.73   BUN/Creatinine Ratio 10 10 - 24   Sodium 129 (L) 134 - 144 mmol/L   Potassium 3.1 (L) 3.5 - 5.2 mmol/L   Chloride 83 (L) 96 - 106 mmol/L   CO2 28 20 - 29 mmol/L   Calcium 9.5 8.6 - 10.2 mg/dL   Total Protein 7.6 6.0 - 8.5 g/dL   Albumin 4.6 3.6 - 4.8 g/dL   Globulin, Total 3.0 1.5 - 4.5 g/dL   Albumin/Globulin Ratio 1.5 1.2 - 2.2   Bilirubin Total 0.4 0.0 - 1.2 mg/dL   Alkaline Phosphatase 148 (H) 39 - 117 IU/L   AST 20 0 - 40 IU/L   ALT 19 0 - 44 IU/L  CBC with Differential  Result Value Ref Range   WBC 2.6 (L) 3.4 - 10.8 x10E3/uL   RBC 4.65 4.14 - 5.80 x10E6/uL   Hemoglobin 14.4 13.0 - 17.7 g/dL   Hematocrit 39.1 37.5 - 51.0 %   MCV 84 79 - 97 fL   MCH 31.0 26.6 - 33.0 pg   MCHC 36.8 (H) 31.5 - 35.7 g/dL   RDW 13.1 12.3 - 15.4 %   Platelets 266 150 - 379 x10E3/uL   Neutrophils 53 Not Estab. %   Lymphs 27 Not Estab. %   Monocytes 15 Not Estab. %   Eos 4 Not Estab. %   Basos 1 Not Estab. %   Neutrophils Absolute 1.4 1.4 - 7.0 x10E3/uL   Lymphocytes Absolute 0.7 0.7 - 3.1 x10E3/uL   Monocytes Absolute 0.4 0.1 - 0.9 x10E3/uL   EOS (ABSOLUTE) 0.1 0.0 - 0.4 x10E3/uL   Basophils Absolute 0.0 0.0 - 0.2 x10E3/uL   Immature Granulocytes 0 Not Estab. %   Immature Grans (Abs) 0.0 0.0 - 0.1 x10E3/uL   Hematology Comments: Note:   Urinalysis, Routine w reflex microscopic  Result Value Ref Range   Specific Gravity, UA 1.010 1.005 - 1.030    pH, UA 7.0 5.0 - 7.5   Color, UA Yellow Yellow   Appearance Ur Hazy (A) Clear   Leukocytes, UA Trace (A) Negative   Protein, UA Negative Negative/Trace   Glucose, UA Negative Negative   Ketones, UA Negative Negative   RBC, UA Negative Negative   Bilirubin, UA Negative Negative   Urobilinogen, Ur 0.2 0.2 - 1.0 mg/dL   Nitrite, UA Negative Negative   Microscopic Examination See below:   Hepatitis C Antibody  Result Value Ref Range   Hep C Virus Ab <0.1 0.0 - 0.9 s/co ratio      Assessment & Plan:   Problem List Items Addressed This Visit      Cardiovascular and Mediastinum   Essential hypertension - Primary    BP improved on recheck today, but home readings variable and persistently elevated. Will add metoprolol to amlodipine and HCTZ regimen for better control. Continue to monitor closely.       Relevant Medications   metoprolol succinate (TOPROL-XL) 50 MG 24 hr tablet   amLODipine (NORVASC) 10 MG tablet    Other Visit Diagnoses    Annual physical exam       Declines all screenings and vaccines today. AWV done. Await lab results   Trichomoniasis       Incidental finding on U/A. Start flagyl, discussed having any sexual partners tested/treated and avoiding contact until completion of tx   Relevant Medications  metroNIDAZOLE (FLAGYL) 500 MG tablet       Discussed aspirin prophylaxis for myocardial infarction prevention and decision was it was not indicated  LABORATORY TESTING:  Health maintenance labs ordered today as discussed above.   The natural history of prostate cancer and ongoing controversy regarding screening and potential treatment outcomes of prostate cancer has been discussed with the patient. The meaning of a false positive PSA and a false negative PSA has been discussed. He indicates understanding of the limitations of this screening test and wishes not to proceed with screening PSA testing.   IMMUNIZATIONS:   - Tdap: Tetanus vaccination status reviewed:  declines vaccine. - Influenza: Refused - Pneumovax: Refused - Prevnar: Refused - Zostavax vaccine: Refused  SCREENING: - Colonoscopy: Refused  Discussed with patient purpose of the colonoscopy is to detect colon cancer at curable precancerous or early stages   PATIENT COUNSELING:    Sexuality: Discussed sexually transmitted diseases, partner selection, use of condoms, avoidance of unintended pregnancy  and contraceptive alternatives.   Advised to avoid cigarette smoking.  I discussed with the patient that most people either abstain from alcohol or drink within safe limits (<=14/week and <=4 drinks/occasion for males, <=7/weeks and <= 3 drinks/occasion for females) and that the risk for alcohol disorders and other health effects rises proportionally with the number of drinks per week and how often a drinker exceeds daily limits.  Discussed cessation/primary prevention of drug use and availability of treatment for abuse.   Diet: Encouraged to adjust caloric intake to maintain  or achieve ideal body weight, to reduce intake of dietary saturated fat and total fat, to limit sodium intake by avoiding high sodium foods and not adding table salt, and to maintain adequate dietary potassium and calcium preferably from fresh fruits, vegetables, and low-fat dairy products.    stressed the importance of regular exercise  Injury prevention: Discussed safety belts, safety helmets, smoke detector, smoking near bedding or upholstery.   Dental health: Discussed importance of regular tooth brushing, flossing, and dental visits.   Follow up plan: NEXT PREVENTATIVE PHYSICAL DUE IN 1 YEAR. Return in about 6 weeks (around 03/24/2018) for BP.

## 2018-02-11 LAB — COMPREHENSIVE METABOLIC PANEL
ALK PHOS: 148 IU/L — AB (ref 39–117)
ALT: 19 IU/L (ref 0–44)
AST: 20 IU/L (ref 0–40)
Albumin/Globulin Ratio: 1.5 (ref 1.2–2.2)
Albumin: 4.6 g/dL (ref 3.6–4.8)
BILIRUBIN TOTAL: 0.4 mg/dL (ref 0.0–1.2)
BUN/Creatinine Ratio: 10 (ref 10–24)
BUN: 11 mg/dL (ref 8–27)
CHLORIDE: 83 mmol/L — AB (ref 96–106)
CO2: 28 mmol/L (ref 20–29)
Calcium: 9.5 mg/dL (ref 8.6–10.2)
Creatinine, Ser: 1.14 mg/dL (ref 0.76–1.27)
GFR calc Af Amer: 77 mL/min/{1.73_m2} (ref >59)
GFR calc non Af Amer: 67 mL/min/{1.73_m2} (ref >59)
GLUCOSE: 84 mg/dL (ref 65–99)
Globulin, Total: 3 g/dL (ref 1.5–4.5)
Potassium: 3.1 mmol/L — ABNORMAL LOW (ref 3.5–5.2)
Sodium: 129 mmol/L — ABNORMAL LOW (ref 134–144)
Total Protein: 7.6 g/dL (ref 6.0–8.5)

## 2018-02-11 LAB — CBC WITH DIFFERENTIAL/PLATELET
BASOS ABS: 0 10*3/uL (ref 0.0–0.2)
Basos: 1 %
EOS (ABSOLUTE): 0.1 10*3/uL (ref 0.0–0.4)
Eos: 4 %
Hematocrit: 39.1 % (ref 37.5–51.0)
Hemoglobin: 14.4 g/dL (ref 13.0–17.7)
IMMATURE GRANS (ABS): 0 10*3/uL (ref 0.0–0.1)
Immature Granulocytes: 0 %
Lymphocytes Absolute: 0.7 10*3/uL (ref 0.7–3.1)
Lymphs: 27 %
MCH: 31 pg (ref 26.6–33.0)
MCHC: 36.8 g/dL — ABNORMAL HIGH (ref 31.5–35.7)
MCV: 84 fL (ref 79–97)
MONOCYTES: 15 %
Monocytes Absolute: 0.4 10*3/uL (ref 0.1–0.9)
Neutrophils Absolute: 1.4 10*3/uL (ref 1.4–7.0)
Neutrophils: 53 %
PLATELETS: 266 10*3/uL (ref 150–379)
RBC: 4.65 x10E6/uL (ref 4.14–5.80)
RDW: 13.1 % (ref 12.3–15.4)
WBC: 2.6 10*3/uL — AB (ref 3.4–10.8)

## 2018-02-11 LAB — HEPATITIS C ANTIBODY

## 2018-02-12 NOTE — Patient Instructions (Signed)
Follow up as needed

## 2018-02-12 NOTE — Assessment & Plan Note (Signed)
BP improved on recheck today, but home readings variable and persistently elevated. Will add metoprolol to amlodipine and HCTZ regimen for better control. Continue to monitor closely.

## 2018-02-13 ENCOUNTER — Telehealth: Payer: Self-pay | Admitting: Family Medicine

## 2018-02-13 DIAGNOSIS — E876 Hypokalemia: Secondary | ICD-10-CM

## 2018-02-13 NOTE — Telephone Encounter (Addendum)
Please let him know that his potassium level is too low, so he should stop the hydrochlorothiazide and eat lots of foods high in potassium such as bananas, sweet potatoes, spinach, etc. We can recheck those levels with a lab draw after about 1 week off from the medicine to make sure they're coming up. His white blood cell count was also abnormal, we can recheck that one at his next OV in a few weeks.I have put the lab order in to recheck his potassium

## 2018-02-13 NOTE — Telephone Encounter (Signed)
Patient notified

## 2018-02-20 ENCOUNTER — Other Ambulatory Visit: Payer: Medicare Other

## 2018-02-20 DIAGNOSIS — E876 Hypokalemia: Secondary | ICD-10-CM

## 2018-02-21 LAB — BASIC METABOLIC PANEL
BUN / CREAT RATIO: 12 (ref 10–24)
BUN: 11 mg/dL (ref 8–27)
CO2: 23 mmol/L (ref 20–29)
Calcium: 8.9 mg/dL (ref 8.6–10.2)
Chloride: 95 mmol/L — ABNORMAL LOW (ref 96–106)
Creatinine, Ser: 0.92 mg/dL (ref 0.76–1.27)
GFR calc Af Amer: 100 mL/min/{1.73_m2} (ref >59)
GFR, EST NON AFRICAN AMERICAN: 86 mL/min/{1.73_m2} (ref >59)
GLUCOSE: 86 mg/dL (ref 65–99)
Potassium: 4.1 mmol/L (ref 3.5–5.2)
SODIUM: 136 mmol/L (ref 134–144)

## 2018-03-31 ENCOUNTER — Ambulatory Visit (INDEPENDENT_AMBULATORY_CARE_PROVIDER_SITE_OTHER): Payer: Medicare Other | Admitting: Family Medicine

## 2018-03-31 ENCOUNTER — Encounter: Payer: Self-pay | Admitting: Family Medicine

## 2018-03-31 VITALS — BP 160/62 | HR 69 | Temp 98.2°F | Wt 157.3 lb

## 2018-03-31 DIAGNOSIS — I10 Essential (primary) hypertension: Secondary | ICD-10-CM | POA: Diagnosis not present

## 2018-03-31 MED ORDER — AMLODIPINE BESYLATE 10 MG PO TABS: 10.0000 mg | ORAL_TABLET | Freq: Every day | ORAL | 1 refills | Status: DC

## 2018-03-31 MED ORDER — METOPROLOL SUCCINATE ER 50 MG PO TB24: 50.0000 mg | ORAL_TABLET | Freq: Every day | ORAL | 1 refills | Status: DC

## 2018-03-31 NOTE — Progress Notes (Signed)
   BP (!) 160/62   Pulse 69   Temp 98.2 F (36.8 C) (Oral)   Wt 157 lb 4.8 oz (71.4 kg)   SpO2 100%   BMI 22.90 kg/m    Subjective:    Patient ID: Robert Larsen, male    DOB: [birth year], 67 y.o.   MRN: [id number]  HPI: Robert Larsen is a 67 y.o. male  Chief Complaint  Patient presents with  . Hypertension    Patient and daughter states BP has been running good. Patient states he feels a lot better and no complaints.  . Follow-up   Pt here today for BP f/u. Doing significantly better overall since d/c of HCTZ. Appetite is back, still having to supplement with nutritional drinks though to get enough calories. Just took his medicine about 30 min ago. Home readings averaging between 120s-140s/60s-70s. Daughter checks manually and records daily readings. Denies side effects with medicines, CP, SOB, HAs, visual changes, syncope.   Relevant past medical, surgical, family and social history reviewed and updated as indicated. Interim medical history since our last visit reviewed. Allergies and medications reviewed and updated.  Review of Systems  Per HPI unless specifically indicated above     Objective:    BP (!) 160/62   Pulse 69   Temp 98.2 F (36.8 C) (Oral)   Wt 157 lb 4.8 oz (71.4 kg)   SpO2 100%   BMI 22.90 kg/m   Wt Readings from Last 3 Encounters:  03/31/18 157 lb 4.8 oz (71.4 kg)  02/10/18 154 lb 1.6 oz (69.9 kg)  02/10/18 154 lb 1.6 oz (69.9 kg)    Physical Exam  Constitutional: He is oriented to person, place, and time. He appears well-developed and well-nourished. No distress.  HENT:  Head: Atraumatic.  Eyes: Pupils are equal, round, and reactive to light. Conjunctivae and EOM are normal.  Neck: Normal range of motion. Neck supple.  Cardiovascular: Normal rate, regular rhythm and normal heart sounds.  Pulmonary/Chest: Effort normal and breath sounds normal.  Musculoskeletal: Normal range of motion.  Neurological: He is alert and oriented to person, place,  and time.  Skin: Skin is warm and dry.  Nursing note and vitals reviewed.   Results for orders placed or performed in visit on 02/20/18  Basic Metabolic Panel (BMET)  Result Value Ref Range   Glucose 86 65 - 99 mg/dL   BUN 11 8 - 27 mg/dL   Creatinine, Ser 4.090.92 0.76 - 1.27 mg/dL   GFR calc non Af Amer 86 >59 mL/min/1.73   GFR calc Af Amer 100 >59 mL/min/1.73   BUN/Creatinine Ratio 12 10 - 24   Sodium 136 134 - 144 mmol/L   Potassium 4.1 3.5 - 5.2 mmol/L   Chloride 95 (L) 96 - 106 mmol/L   CO2 23 20 - 29 mmol/L   Calcium 8.9 8.6 - 10.2 mg/dL      Assessment & Plan:   Problem List Items Addressed This Visit      Cardiovascular and Mediastinum   Essential hypertension - Primary    Significant improvement with current regimen. Home BPs under great control, and pt overall feeling better than he has in years. Continue current regimen, DASH diet, exercise      Relevant Medications   amLODipine (NORVASC) 10 MG tablet   metoprolol succinate (TOPROL-XL) 50 MG 24 hr tablet       Follow up plan: Return in about 6 months (around 09/30/2018) for BP f/u .

## 2018-04-02 NOTE — Patient Instructions (Signed)
Follow up in 6 months 

## 2018-04-02 NOTE — Assessment & Plan Note (Signed)
Significant improvement with current regimen. Home BPs under great control, and pt overall feeling better than he has in years. Continue current regimen, DASH diet, exercise

## 2018-09-29 ENCOUNTER — Ambulatory Visit (INDEPENDENT_AMBULATORY_CARE_PROVIDER_SITE_OTHER): Payer: Medicare Other | Admitting: Family Medicine

## 2018-09-29 ENCOUNTER — Encounter: Payer: Self-pay | Admitting: Family Medicine

## 2018-09-29 VITALS — BP 150/90 | HR 78 | Ht 69.5 in | Wt 161.2 lb

## 2018-09-29 DIAGNOSIS — I10 Essential (primary) hypertension: Secondary | ICD-10-CM

## 2018-09-29 MED ORDER — LOSARTAN POTASSIUM 50 MG PO TABS: 50.0000 mg | ORAL_TABLET | Freq: Every day | ORAL | 0 refills | Status: DC

## 2018-09-29 NOTE — Assessment & Plan Note (Signed)
Not at goal in office. Home readings per patient at goal. Will add losartan and continue working on DASH diet, stress levels. Recheck in 1 month. Write readings down and bring to appt

## 2018-09-29 NOTE — Progress Notes (Signed)
   BP (!) 150/90   Pulse 78   Ht 5' 9.5" (1.765 m)   Wt 161 lb 3 oz (73.1 kg)   SpO2 98%   BMI 23.46 kg/m    Subjective:    Patient ID: Robert Larsen, male    DOB: 24-May-1951, 67 y.o.   MRN: [id number]  HPI: Robert Larsen is a 68 y.o. male  Chief Complaint  Patient presents with  . Hypertension   Here today for 6 month BP f/u. Took pills just before he came today. Home readings 130s/70s-80s. Doing very well on current regimen, taking pills faithfully without side effects. Denies CP, SOB, HAs, dizziness.   Relevant past medical, surgical, family and social history reviewed and updated as indicated. Interim medical history since our last visit reviewed. Allergies and medications reviewed and updated.  Review of Systems  Per HPI unless specifically indicated above     Objective:    BP (!) 150/90   Pulse 78   Ht 5' 9.5" (1.765 m)   Wt 161 lb 3 oz (73.1 kg)   SpO2 98%   BMI 23.46 kg/m   Wt Readings from Last 3 Encounters:  09/29/18 161 lb 3 oz (73.1 kg)  03/31/18 157 lb 4.8 oz (71.4 kg)  02/10/18 154 lb 1.6 oz (69.9 kg)    Physical Exam  Constitutional: He is oriented to person, place, and time. He appears well-developed and well-nourished. No distress.  HENT:  Head: Atraumatic.  Eyes: Conjunctivae and EOM are normal.  Neck: Normal range of motion. Neck supple.  Cardiovascular: Normal rate and regular rhythm.  Pulmonary/Chest: Effort normal and breath sounds normal.  Neurological: He is alert and oriented to person, place, and time. No cranial nerve deficit.  Skin: Skin is warm and dry.  Psychiatric: He has a normal mood and affect. His behavior is normal.  Nursing note and vitals reviewed.   Results for orders placed or performed in visit on 02/20/18  Basic Metabolic Panel (BMET)  Result Value Ref Range   Glucose 86 65 - 99 mg/dL   BUN 11 8 - 27 mg/dL   Creatinine, Ser 1.30 0.76 - 1.27 mg/dL   GFR calc non Af Amer 86 >59 mL/min/1.73   GFR calc Af Amer 100  >59 mL/min/1.73   BUN/Creatinine Ratio 12 10 - 24   Sodium 136 134 - 144 mmol/L   Potassium 4.1 3.5 - 5.2 mmol/L   Chloride 95 (L) 96 - 106 mmol/L   CO2 23 20 - 29 mmol/L   Calcium 8.9 8.6 - 10.2 mg/dL      Assessment & Plan:   Problem List Items Addressed This Visit      Cardiovascular and Mediastinum   Essential hypertension - Primary    Not at goal in office. Home readings per patient at goal. Will add losartan and continue working on DASH diet, stress levels. Recheck in 1 month. Write readings down and bring to appt      Relevant Medications   losartan (COZAAR) 50 MG tablet   Other Relevant Orders   Basic Metabolic Panel (BMET)       Follow up plan: Return in about 4 weeks (around 10/27/2018) for BP check.

## 2018-09-29 NOTE — Patient Instructions (Signed)
Follow up in 1 month   

## 2018-09-30 LAB — BASIC METABOLIC PANEL
BUN / CREAT RATIO: 10 (ref 10–24)
BUN: 10 mg/dL (ref 8–27)
CO2: 21 mmol/L (ref 20–29)
CREATININE: 1 mg/dL (ref 0.76–1.27)
Calcium: 9.6 mg/dL (ref 8.6–10.2)
Chloride: 96 mmol/L (ref 96–106)
GFR, EST AFRICAN AMERICAN: 90 mL/min/{1.73_m2} (ref >59)
GFR, EST NON AFRICAN AMERICAN: 78 mL/min/{1.73_m2} (ref >59)
GLUCOSE: 82 mg/dL (ref 65–99)
Potassium: 4.1 mmol/L (ref 3.5–5.2)
SODIUM: 135 mmol/L (ref 134–144)

## 2018-10-01 ENCOUNTER — Telehealth: Payer: Self-pay | Admitting: Family Medicine

## 2018-10-01 MED ORDER — HYDRALAZINE HCL 10 MG PO TABS: 10.0000 mg | ORAL_TABLET | Freq: Three times a day (TID) | ORAL | 1 refills | Status: DC

## 2018-10-01 NOTE — Telephone Encounter (Signed)
Patient's daughter notified.

## 2018-10-01 NOTE — Telephone Encounter (Signed)
Copied from CRM [id number]. Topic: Quick Communication - Rx Refill/Question >> Oct 01, 2018  8:37 AM Luanna Cole wrote: Medication:losartan (COZAAR) 50 MG tablet [[id number]]  pt daughter called and stated that this medication has been recalled. Can we get something else instead. Please advise [phone]

## 2018-10-01 NOTE — Telephone Encounter (Signed)
Given his other intolerances, will start hydralazine TID and monitor closely - d/c if readings drop too low or having tolerance issues and call right away. WIll see back as scheduled in 1 month. Work on [employer]

## 2018-10-27 ENCOUNTER — Ambulatory Visit (INDEPENDENT_AMBULATORY_CARE_PROVIDER_SITE_OTHER): Payer: Medicare Other | Admitting: Family Medicine

## 2018-10-27 ENCOUNTER — Encounter: Payer: Self-pay | Admitting: Family Medicine

## 2018-10-27 VITALS — BP 160/70 | HR 68 | Temp 98.5°F | Ht 69.5 in | Wt 160.5 lb

## 2018-10-27 DIAGNOSIS — I10 Essential (primary) hypertension: Secondary | ICD-10-CM | POA: Diagnosis not present

## 2018-10-27 MED ORDER — METOPROLOL SUCCINATE ER 50 MG PO TB24: 50.0000 mg | ORAL_TABLET | Freq: Every day | ORAL | 1 refills | Status: DC

## 2018-10-27 MED ORDER — AMLODIPINE BESYLATE 10 MG PO TABS: 10.0000 mg | ORAL_TABLET | Freq: Every day | ORAL | 1 refills | Status: DC

## 2018-10-27 NOTE — Progress Notes (Signed)
BP (!) 160/70   Pulse 68   Temp 98.5 F (36.9 C)   Ht 5' 9.5" (1.765 m)   Wt 160 lb 8 oz (72.8 kg)   SpO2 99%   BMI 23.36 kg/m    Subjective:    Patient ID: Robert Larsen, male    DOB: 1951/05/09, 67 y.o.   MRN: [id number]  HPI: Robert PeaJohnny Sigler is a 67 y.o. male  Chief Complaint  Patient presents with  . Hypertension   Here today for BP f/u after addition of hydralazine 10 mg. Home BPs have been 140s/80s, sometimes lower. Hydralazine 3 times daily made it drop too quickly and caused him to feel very badly. Hydralazine twice daily seems to be doing well. Also taking 10 mg amlodipine and metoprolol. Working on [employer] diet, does not have a regular exercise regimen. Denies CP, SOB, HAs, dizziness.   Relevant past medical, surgical, family and social history reviewed and updated as indicated. Interim medical history since our last visit reviewed. Allergies and medications reviewed and updated.  Review of Systems  Per HPI unless specifically indicated above     Objective:    BP (!) 160/70   Pulse 68   Temp 98.5 F (36.9 C)   Ht 5' 9.5" (1.765 m)   Wt 160 lb 8 oz (72.8 kg)   SpO2 99%   BMI 23.36 kg/m   Wt Readings from Last 3 Encounters:  10/27/18 160 lb 8 oz (72.8 kg)  09/29/18 161 lb 3 oz (73.1 kg)  03/31/18 157 lb 4.8 oz (71.4 kg)    Physical Exam  Constitutional: He is oriented to person, place, and time. He appears well-developed and well-nourished. No distress.  HENT:  Head: Atraumatic.  Eyes: Pupils are equal, round, and reactive to light. Conjunctivae and EOM are normal.  Neck: Normal range of motion. Neck supple.  Cardiovascular: Normal rate, regular rhythm and normal heart sounds.  Pulmonary/Chest: Effort normal and breath sounds normal.  Musculoskeletal: Normal range of motion.  Neurological: He is alert and oriented to person, place, and time.  Skin: Skin is warm and dry.  Psychiatric: He has a normal mood and affect. His behavior is normal.  Nursing  note and vitals reviewed.   Results for orders placed or performed in visit on 09/29/18  Basic Metabolic Panel (BMET)  Result Value Ref Range   Glucose 82 65 - 99 mg/dL   BUN 10 8 - 27 mg/dL   Creatinine, Ser 2.441.00 0.76 - 1.27 mg/dL   GFR calc non Af Amer 78 >59 mL/min/1.73   GFR calc Af Amer 90 >59 mL/min/1.73   BUN/Creatinine Ratio 10 10 - 24   Sodium 135 134 - 144 mmol/L   Potassium 4.1 3.5 - 5.2 mmol/L   Chloride 96 96 - 106 mmol/L   CO2 21 20 - 29 mmol/L   Calcium 9.6 8.6 - 10.2 mg/dL      Assessment & Plan:   Problem List Items Addressed This Visit      Cardiovascular and Mediastinum   Essential hypertension - Primary    BP improved with addition of hydralazine. Continue BID dosing as TID dosing caused symptomatic hypotension. Continue current regimen and DASH diet, exercise. He will keep monitoring home BPs and call with persistent abnormals      Relevant Medications   metoprolol succinate (TOPROL-XL) 50 MG 24 hr tablet   amLODipine (NORVASC) 10 MG tablet       Follow up plan: Return in about 6 months (around  04/27/2019) for BP.

## 2018-10-27 NOTE — Assessment & Plan Note (Signed)
BP improved with addition of hydralazine. Continue BID dosing as TID dosing caused symptomatic hypotension. Continue current regimen and DASH diet, exercise. He will keep monitoring home BPs and call with persistent abnormals

## 2018-10-27 NOTE — Patient Instructions (Signed)
Follow up in 6 months 

## 2018-11-28 ENCOUNTER — Other Ambulatory Visit: Payer: Self-pay | Admitting: Family Medicine

## 2018-11-28 NOTE — Telephone Encounter (Signed)
Requested medication (s) are due for refill today: yes  Requested medication (s) are on the active medication list: Yes  Last refill:  10/01/18  #90  0 refill  Future visit scheduled: yes  Notes to clinic: Sig has changed. OV note state pt to take twice a day    Requested Prescriptions  Pending Prescriptions Disp Refills   hydrALAZINE (APRESOLINE) 10 MG tablet [Pharmacy Med Name: HYDRALAZINE HCL 10 MG TAB] 90 tablet 1    Sig: TAKE 1 TABLET BY MOUTH 3 TIMES DAILY     Cardiovascular:  Vasodilators Failed - 11/28/2018  4:56 PM      Failed - WBC in normal range and within 360 days    WBC  Date Value Ref Range Status  02/10/2018 2.6 (L) 3.4 - 10.8 x10E3/uL Final         Failed - Last BP in normal range    BP Readings from Last 1 Encounters:  10/27/18 (!) 160/70         Passed - HCT in normal range and within 360 days    Hematocrit  Date Value Ref Range Status  02/10/2018 39.1 37.5 - 51.0 % Final         Passed - HGB in normal range and within 360 days    Hemoglobin  Date Value Ref Range Status  02/10/2018 14.4 13.0 - 17.7 g/dL Final         Passed - RBC in normal range and within 360 days    RBC  Date Value Ref Range Status  02/10/2018 4.65 4.14 - 5.80 x10E6/uL Final         Passed - PLT in normal range and within 360 days    Platelets  Date Value Ref Range Status  02/10/2018 266 150 - 379 x10E3/uL Final         Passed - Valid encounter within last 12 months    Recent Outpatient Visits          1 month ago Essential hypertension   [facility] Particia Nearing[place], [place]   2 months ago Essential hypertension   [facility] [place]velt Maser[place], [place]   8 months ago Essential hypertension   [facility] [place] Maser[place], [place], [place]   9 months ago Essential hypertension   [facility] [place], [place]   10 months ago Essential hypertension   [facility] [place], Salley He[place], [place]      Future Appointments            In 2 months  [facility]   In 5 months [place], Salley Hewsachel [place], PA-C [facility]

## 2019-01-28 ENCOUNTER — Other Ambulatory Visit: Payer: Self-pay | Admitting: Family Medicine

## 2019-01-28 NOTE — Telephone Encounter (Signed)
Patient to continue medication per last OV- return in May. Requested Prescriptions  Pending Prescriptions Disp Refills  . metoprolol succinate (TOPROL-XL) 50 MG 24 hr tablet [Pharmacy Med Name: METOPROLOL SUCCINATE ER 50 MG TAB] 90 tablet 1    Sig: TAKE 1 TABLET BY MOUTH ONCE DAILY . TAKEWITH OR FOLLOWING MEAL     Cardiovascular:  Beta Blockers Failed - 01/28/2019  9:48 AM      Failed - Last BP in normal range    BP Readings from Last 1 Encounters:  10/27/18 (!) 160/70         Passed - Last Heart Rate in normal range    Pulse Readings from Last 1 Encounters:  10/27/18 68         Passed - Valid encounter within last 6 months    Recent Outpatient Visits          3 months ago Essential hypertension   [facility] Roosvelt Maser [place], [place]   4 months ago Essential hypertension   [facility] Roosvelt Maser [place], [place]   10 months ago Essential hypertension   [facility] Roosvelt Maser [place], [place]   11 months ago Essential hypertension   [facility] Roosvelt Maser [place], [place]   1 year ago Essential hypertension   [facility] [place], Salley Hews, [place]      Future Appointments            In 2 weeks  [facility]   In 2 months Maurice March, Salley Hews, PA-C [facility]         . hydrALAZINE (APRESOLINE) 10 MG tablet [Pharmacy Med Name: HYDRALAZINE HCL 10 MG TAB] 60 tablet 2    Sig: TAKE 1 TABLET BY MOUTH TWICE DAILY     Cardiovascular:  Vasodilators Failed - 01/28/2019  9:48 AM      Failed - WBC in normal range and within 360 days    WBC  Date Value Ref Range Status  02/10/2018 2.6 (L) 3.4 - 10.8 x10E3/uL Final         Failed - Last BP in normal range    BP Readings from Last 1 Encounters:  10/27/18 (!) 160/70         Passed - HCT in normal range and within 360 days    Hematocrit  Date Value Ref Range Status  02/10/2018 39.1 37.5 - 51.0 % Final        Passed - HGB in normal range and within 360 days    Hemoglobin  Date Value Ref Range Status  02/10/2018 14.4 13.0 - 17.7 g/dL Final         Passed - RBC in normal range and within 360 days    RBC  Date Value Ref Range Status  02/10/2018 4.65 4.14 - 5.80 x10E6/uL Final         Passed - PLT in normal range and within 360 days    Platelets  Date Value Ref Range Status  02/10/2018 266 150 - 379 x10E3/uL Final         Passed - Valid encounter within last 12 months    Recent Outpatient Visits          3 months ago Essential hypertension   [facility] Particia Nearing, [place]   4 months ago Essential hypertension   [facility] Roosvelt Maser [place], [place]   10 months ago Essential hypertension   [facility] Roosvelt Maser [place],  [place]   11 months ago Essential hypertension   [facility] Roosvelt Maser [place], [place]   1 year ago Essential hypertension   [facility] Particia Nearing, [place]      Future Appointments            In 2 weeks  [facility]   In 2 months Maurice March, Salley Hews, PA-C [facility], [place]

## 2019-02-16 ENCOUNTER — Ambulatory Visit: Payer: Self-pay

## 2019-04-27 ENCOUNTER — Ambulatory Visit: Payer: Medicare Other | Admitting: Family Medicine

## 2019-04-29 ENCOUNTER — Ambulatory Visit: Payer: Medicare Other | Admitting: Family Medicine

## 2019-05-01 ENCOUNTER — Telehealth: Payer: Self-pay | Admitting: Family Medicine

## 2019-05-01 NOTE — Telephone Encounter (Signed)
Copied from CRM [id number]. Topic: Quick Communication - Rx Refill/Question >> May 01, 2019  6:04 PM Zada Girt, Lumin L wrote: Medication: hydrALAZINE (APRESOLINE) 10 MG tablet, metoprolol succinate (TOPROL-XL) 50 MG 24 hr tablet , amLODipine (NORVASC) 10 MG tablet (no pills left)  Has the patient contacted their pharmacy? {yes  (Agent: If no, request that the patient contact the pharmacy for the refill.) (Agent: If yes, when and what did the pharmacy advise?)  Preferred Pharmacy (with phone number or street name): [facility] [place] - [address] [place] [address] Phone: [phone] Fax: [phone]  Agent: Please be advised that RX refills may take up to 3 business days. We ask that you follow-up with your pharmacy.

## 2019-05-05 ENCOUNTER — Other Ambulatory Visit: Payer: Self-pay | Admitting: Family Medicine

## 2019-05-05 MED ORDER — METOPROLOL SUCCINATE ER 50 MG PO TB24: 50.0000 mg | ORAL_TABLET | Freq: Every day | ORAL | 0 refills | Status: DC

## 2019-05-05 MED ORDER — HYDRALAZINE HCL 10 MG PO TABS: 10.0000 mg | ORAL_TABLET | Freq: Two times a day (BID) | ORAL | 0 refills | Status: DC

## 2019-05-05 NOTE — Telephone Encounter (Signed)
Called pt and advised him that refills have been sent. Pt understood.

## 2019-05-05 NOTE — Telephone Encounter (Signed)
Robert Larsen called about her dads refills and the status of the refill / please advise

## 2019-05-05 NOTE — Telephone Encounter (Signed)
Rx's refilled, recheck visit scheduled for next month. Please let him know all of his meds should be at the pharmacy

## 2019-05-07 ENCOUNTER — Ambulatory Visit: Payer: Medicare Other | Admitting: Family Medicine

## 2019-05-15 ENCOUNTER — Encounter: Payer: Self-pay | Admitting: Family Medicine

## 2019-05-15 ENCOUNTER — Ambulatory Visit (INDEPENDENT_AMBULATORY_CARE_PROVIDER_SITE_OTHER): Payer: Medicare Other | Admitting: Family Medicine

## 2019-05-15 ENCOUNTER — Other Ambulatory Visit: Payer: Self-pay

## 2019-05-15 VITALS — BP 150/78 | HR 65 | Temp 98.4°F

## 2019-05-15 DIAGNOSIS — I10 Essential (primary) hypertension: Secondary | ICD-10-CM | POA: Diagnosis not present

## 2019-05-15 NOTE — Assessment & Plan Note (Signed)
Slightly above goal in office on recheck but wnl at home. Pt has history of hypotensive sxs with slight increase in hydralazine dose so will keep medications where they are and continue to monitor closely. Pt to call if home BP readings stay persistently abnormal

## 2019-05-15 NOTE — Progress Notes (Signed)
BP (!) 150/78   Pulse 65   Temp 98.4 F (36.9 C) (Oral)   SpO2 99%    Subjective:    Patient ID: Robert Larsen, male    DOB: 10-Jul-1951, 68 y.o.   MRN: [id number]  HPI: Robert Larsen is a 68 y.o. male  Chief Complaint  Patient presents with  . Hypertension   Here today for HTN f/u. Home BPs have been 130s-140/80s range. Taking his medications faithfully without side effects. Denies CP, SOB, HAs, dizziness. Has been compliant with DASH diet and tries to stay active. States he has "white coat HTN".   Relevant past medical, surgical, family and social history reviewed and updated as indicated. Interim medical history since our last visit reviewed. Allergies and medications reviewed and updated.  Review of Systems  Per HPI unless specifically indicated above     Objective:    BP (!) 150/78   Pulse 65   Temp 98.4 F (36.9 C) (Oral)   SpO2 99%   Wt Readings from Last 3 Encounters:  10/27/18 160 lb 8 oz (72.8 kg)  09/29/18 161 lb 3 oz (73.1 kg)  03/31/18 157 lb 4.8 oz (71.4 kg)    Physical Exam Vitals signs and nursing note reviewed.  Constitutional:      Appearance: Normal appearance.  HENT:     Head: Atraumatic.  Eyes:     Extraocular Movements: Extraocular movements intact.     Conjunctiva/sclera: Conjunctivae normal.  Neck:     Musculoskeletal: Normal range of motion and neck supple.  Cardiovascular:     Rate and Rhythm: Normal rate and regular rhythm.  Pulmonary:     Effort: Pulmonary effort is normal.     Breath sounds: Normal breath sounds.  Musculoskeletal: Normal range of motion.  Skin:    General: Skin is warm and dry.  Neurological:     General: No focal deficit present.     Mental Status: He is oriented to person, place, and time.  Psychiatric:        Mood and Affect: Mood normal.        Thought Content: Thought content normal.        Judgment: Judgment normal.     Results for orders placed or performed in visit on 05/15/19  Basic  metabolic panel  Result Value Ref Range   Glucose 80 65 - 99 mg/dL   BUN 12 8 - 27 mg/dL   Creatinine, Ser 4.62 0.76 - 1.27 mg/dL   GFR calc non Af Amer 75 >59 mL/min/1.73   GFR calc Af Amer 86 >59 mL/min/1.73   BUN/Creatinine Ratio 12 10 - 24   Sodium 133 (L) 134 - 144 mmol/L   Potassium 4.3 3.5 - 5.2 mmol/L   Chloride 97 96 - 106 mmol/L   CO2 19 (L) 20 - 29 mmol/L   Calcium 9.2 8.6 - 10.2 mg/dL      Assessment & Plan:   Problem List Items Addressed This Visit      Cardiovascular and Mediastinum   Essential hypertension - Primary    Slightly above goal in office on recheck but wnl at home. Pt has history of hypotensive sxs with slight increase in hydralazine dose so will keep medications where they are and continue to monitor closely. Pt to call if home BP readings stay persistently abnormal      Relevant Orders   Basic metabolic panel (Completed)       Follow up plan: Return in about 6 months (around  11/14/2019) for CPE.

## 2019-05-16 LAB — BASIC METABOLIC PANEL
BUN/Creatinine Ratio: 12 (ref 10–24)
BUN: 12 mg/dL (ref 8–27)
CO2: 19 mmol/L — ABNORMAL LOW (ref 20–29)
Calcium: 9.2 mg/dL (ref 8.6–10.2)
Chloride: 97 mmol/L (ref 96–106)
Creatinine, Ser: 1.03 mg/dL (ref 0.76–1.27)
GFR calc Af Amer: 86 mL/min/{1.73_m2} (ref >59)
GFR calc non Af Amer: 75 mL/min/{1.73_m2} (ref >59)
Glucose: 80 mg/dL (ref 65–99)
Potassium: 4.3 mmol/L (ref 3.5–5.2)
Sodium: 133 mmol/L — ABNORMAL LOW (ref 134–144)

## 2019-07-23 ENCOUNTER — Other Ambulatory Visit: Payer: Self-pay | Admitting: Family Medicine

## 2019-09-03 ENCOUNTER — Other Ambulatory Visit: Payer: Self-pay | Admitting: Family Medicine

## 2019-10-30 ENCOUNTER — Other Ambulatory Visit: Payer: Self-pay | Admitting: Family Medicine

## 2019-11-26 ENCOUNTER — Encounter: Payer: Medicare Other | Admitting: Family Medicine

## 2019-12-14 ENCOUNTER — Telehealth: Payer: Self-pay | Admitting: Family Medicine

## 2019-12-14 NOTE — Chronic Care Management (AMB) (Signed)
  Chronic Care Management   Outreach Note  12/14/2019 Name: Robert Larsen MRN: [id number] DOB: [birth year]  Referred by: Particia Nearing, PA-C Reason for referral : Chronic Care Management (Initial CCM outreach was unsuccessful. )   An unsuccessful telephone outreach was attempted today. The patient was referred to the case management team by for assistance with care management and care coordination.   Follow Up Plan: A HIPPA compliant phone message was left for the patient providing contact information and requesting a return call.  The care management team will reach out to the patient again over the next 7 days.  If patient returns call to provider office, please advise to call Embedded Care Management Care Guide Randon Goldsmith at [phone]  [facility] Guide, Embedded Care Coordination [facility]  [place], [place] [address] Direct Dial: [phone] [email or web address]  [email or web address]: [email or web address]

## 2019-12-17 ENCOUNTER — Encounter: Payer: Self-pay | Admitting: Family Medicine

## 2019-12-17 ENCOUNTER — Other Ambulatory Visit: Payer: Self-pay

## 2019-12-17 ENCOUNTER — Ambulatory Visit (INDEPENDENT_AMBULATORY_CARE_PROVIDER_SITE_OTHER): Payer: Medicare Other | Admitting: Family Medicine

## 2019-12-17 VITALS — BP 138/70 | HR 66 | Temp 98.7°F | Ht 69.5 in | Wt 168.0 lb

## 2019-12-17 DIAGNOSIS — Z0000 Encounter for general adult medical examination without abnormal findings: Secondary | ICD-10-CM

## 2019-12-17 DIAGNOSIS — I10 Essential (primary) hypertension: Secondary | ICD-10-CM

## 2019-12-17 LAB — UA/M W/RFLX CULTURE, ROUTINE
Bilirubin, UA: NEGATIVE
Glucose, UA: NEGATIVE
Ketones, UA: NEGATIVE
Leukocytes,UA: NEGATIVE
Nitrite, UA: NEGATIVE
Protein,UA: NEGATIVE
RBC, UA: NEGATIVE
Specific Gravity, UA: 1.01 (ref 1.005–1.030)
Urobilinogen, Ur: 0.2 mg/dL (ref 0.2–1.0)
pH, UA: 6 (ref 5.0–7.5)

## 2019-12-17 MED ORDER — METOPROLOL SUCCINATE ER 50 MG PO TB24: 50.0000 mg | ORAL_TABLET | Freq: Every day | ORAL | 1 refills | Status: AC

## 2019-12-17 MED ORDER — AMLODIPINE BESYLATE 10 MG PO TABS: 10.0000 mg | ORAL_TABLET | Freq: Every day | ORAL | 1 refills | Status: AC

## 2019-12-17 MED ORDER — HYDRALAZINE HCL 10 MG PO TABS: 10.0000 mg | ORAL_TABLET | Freq: Two times a day (BID) | ORAL | 1 refills | Status: AC

## 2019-12-17 NOTE — Assessment & Plan Note (Signed)
BP stable and under good control, continue current regimen 

## 2019-12-17 NOTE — Progress Notes (Signed)
BP 138/70   Pulse 66   Temp 98.7 F (37.1 C) (Oral)   Ht 5' 9.5" (1.765 m)   Wt 168 lb (76.2 kg)   SpO2 99%   BMI 24.45 kg/m    Subjective:    Patient ID: Robert Larsen, male    DOB: 09/21/1951, 69 y.o.   MRN: [id number]  HPI: Robert Larsen is a 69 y.o. male presenting on 12/17/2019 for comprehensive medical examination. Current medical complaints include:see below  HTN - Home BPs Typically running 130s-140s/80s. Taking his medication faithfully without side effects. Denies CP, SOB, HAs, dizziness. Trying to eat low sodium diet, stays active.   He currently lives with: Interim Problems from his last visit: no  Depression Screen done today and results listed below:  Depression screen [facility] 2/9 12/17/2019 02/10/2018  Decreased Interest 0 0  Down, Depressed, Hopeless 0 0  PHQ - 2 Score 0 0  Altered sleeping 0 0  Tired, decreased energy 0 1  Change in appetite 0 2  Feeling bad or failure about yourself  0 0  Trouble concentrating 0 0  Moving slowly or fidgety/restless 0 0  Suicidal thoughts 0 0  PHQ-9 Score 0 3  Difficult doing work/chores - Not difficult at all    The patient does not have a history of falls. I did complete a risk assessment for falls. A plan of care for falls was documented.   Past Medical History:  Past Medical History:  Diagnosis Date  . Hypertension     Surgical History:  No past surgical history on file.  Medications:  No current outpatient medications on file prior to visit.   No current facility-administered medications on file prior to visit.    Allergies:  Allergies  Allergen Reactions  . Hydrochlorothiazide     Loss of appetite, overall not feeling well  . Lisinopril Other (See Comments)    Refuses to take as mutliple family members have had facial swelling and breathing issues on it    Social History:  Social History   Socioeconomic History  . Marital status: Single    Spouse name: Not on file  . Number of children: Not on  file  . Years of education: Not on file  . Highest education level: Not on file  Occupational History  . Not on file  Tobacco Use  . Smoking status: Current Every Day Smoker    Packs/day: 0.25    Types: Cigarettes  . Smokeless tobacco: Never Used  . Tobacco comment: 1 pack a week   Substance and Sexual Activity  . Alcohol use: Yes    Alcohol/week: 28.0 standard drinks    Types: 28 Cans of beer per week    Comment: 4 cans beer/day  . Drug use: No  . Sexual activity: Not on file  Other Topics Concern  . Not on file  Social History Narrative  . Not on file   Social Determinants of Health   Financial Resource Strain:   . Difficulty of Paying Living Expenses: Not on file  Food Insecurity:   . Worried About Charity fundraiser in the Last Year: Not on file  . Ran Out of Food in the Last Year: Not on file  Transportation Needs:   . Lack of Transportation (Medical): Not on file  . Lack of Transportation (Non-Medical): Not on file  Physical Activity:   . Days of Exercise per Week: Not on file  . Minutes of Exercise per Session: Not on file  Stress:   . Feeling of Stress : Not on file  Social Connections:   . Frequency of Communication with Friends and Family: Not on file  . Frequency of Social Gatherings with Friends and Family: Not on file  . Attends Religious Services: Not on file  . Active Member of Clubs or Organizations: Not on file  . Attends Archivist Meetings: Not on file  . Marital Status: Not on file  Intimate Partner Violence:   . Fear of Current or Ex-Partner: Not on file  . Emotionally Abused: Not on file  . Physically Abused: Not on file  . Sexually Abused: Not on file   Social History   Tobacco Use  Smoking Status Current Every Day Smoker  . Packs/day: 0.25  . Types: Cigarettes  Smokeless Tobacco Never Used  Tobacco Comment   1 pack a week    Social History   Substance and Sexual Activity  Alcohol Use Yes  . Alcohol/week: 28.0 standard  drinks  . Types: 28 Cans of beer per week   Comment: 4 cans beer/day    Family History:  Family History  Problem Relation Age of Onset  . Hypertension Mother   . Cancer Mother        Breast, HER2  . Osteoporosis Mother   . Stroke Father   . Heart attack Father   . Hypertension Daughter   . Diabetes Maternal Aunt   . Hypertension Maternal Aunt   . Cancer Maternal Uncle   . Heart attack Paternal Grandmother   . Heart attack Paternal Grandfather   . Hypertension Other   . Heart attack Other   . Congestive Heart Failure Other     Past medical history, surgical history, medications, allergies, family history and social history reviewed with patient today and changes made to appropriate areas of the chart.   Review of Systems - General ROS: negative Psychological ROS: negative Ophthalmic ROS: negative ENT ROS: negative Allergy and Immunology ROS: negative Hematological and Lymphatic ROS: negative Endocrine ROS: negative Breast ROS: negative for breast lumps Respiratory ROS: no cough, shortness of breath, or wheezing Cardiovascular ROS: no chest pain or dyspnea on exertion Gastrointestinal ROS: no abdominal pain, change in bowel habits, or black or bloody stools Genito-Urinary ROS: no dysuria, trouble voiding, or hematuria Musculoskeletal ROS: negative Neurological ROS: no TIA or stroke symptoms Dermatological ROS: negative All other ROS negative except what is listed above and in the HPI.      Objective:    BP 138/70   Pulse 66   Temp 98.7 F (37.1 C) (Oral)   Ht 5' 9.5" (1.765 m)   Wt 168 lb (76.2 kg)   SpO2 99%   BMI 24.45 kg/m   Wt Readings from Last 3 Encounters:  12/17/19 168 lb (76.2 kg)  10/27/18 160 lb 8 oz (72.8 kg)  09/29/18 161 lb 3 oz (73.1 kg)    Physical Exam Vitals and nursing note reviewed.  Constitutional:      General: He is not in acute distress.    Appearance: He is well-developed.  HENT:     Head: Atraumatic.     Right Ear: Tympanic  membrane and external ear normal.     Left Ear: Tympanic membrane and external ear normal.     Nose: Nose normal.     Mouth/Throat:     Mouth: Mucous membranes are moist.     Pharynx: Oropharynx is clear.  Eyes:     General: No scleral icterus.  Conjunctiva/sclera: Conjunctivae normal.     Pupils: Pupils are equal, round, and reactive to light.  Cardiovascular:     Rate and Rhythm: Normal rate and regular rhythm.     Heart sounds: Normal heart sounds. No murmur.  Pulmonary:     Effort: Pulmonary effort is normal. No respiratory distress.     Breath sounds: Normal breath sounds.  Abdominal:     General: Bowel sounds are normal. There is no distension.     Palpations: Abdomen is soft. There is no mass.     Tenderness: There is no abdominal tenderness. There is no guarding.  Genitourinary:    Comments: GU exam declined Musculoskeletal:        General: No tenderness. Normal range of motion.     Cervical back: Normal range of motion and neck supple.  Skin:    General: Skin is warm and dry.     Findings: No rash.  Neurological:     General: No focal deficit present.     Mental Status: He is alert and oriented to person, place, and time.     Deep Tendon Reflexes: Reflexes are normal and symmetric.  Psychiatric:        Mood and Affect: Mood normal.        Behavior: Behavior normal.        Thought Content: Thought content normal.        Judgment: Judgment normal.     Results for orders placed or performed in visit on 21/97/58  Basic metabolic panel  Result Value Ref Range   Glucose 80 65 - 99 mg/dL   BUN 12 8 - 27 mg/dL   Creatinine, Ser 1.03 0.76 - 1.27 mg/dL   GFR calc non Af Amer 75 >59 mL/min/1.73   GFR calc Af Amer 86 >59 mL/min/1.73   BUN/Creatinine Ratio 12 10 - 24   Sodium 133 (L) 134 - 144 mmol/L   Potassium 4.3 3.5 - 5.2 mmol/L   Chloride 97 96 - 106 mmol/L   CO2 19 (L) 20 - 29 mmol/L   Calcium 9.2 8.6 - 10.2 mg/dL      Assessment & Plan:   Problem List  Items Addressed This Visit      Cardiovascular and Mediastinum   Essential hypertension    BP stable and under good control, continue current regimen      Relevant Medications   amLODipine (NORVASC) 10 MG tablet   hydrALAZINE (APRESOLINE) 10 MG tablet   metoprolol succinate (TOPROL-XL) 50 MG 24 hr tablet   Other Relevant Orders   CBC with Differential/Platelet out   Comprehensive metabolic panel   UA/M w/rflx Culture, Routine    Other Visit Diagnoses    Annual physical exam    -  Primary   Relevant Orders   Lipid Panel w/o Chol/HDL Ratio out       Discussed aspirin prophylaxis for myocardial infarction prevention and decision was it was not indicated  LABORATORY TESTING:  Health maintenance labs ordered today as discussed above.   The natural history of prostate cancer and ongoing controversy regarding screening and potential treatment outcomes of prostate cancer has been discussed with the patient. The meaning of a false positive PSA and a false negative PSA has been discussed. He indicates understanding of the limitations of this screening test and wishes not to proceed with screening PSA testing.   IMMUNIZATIONS:   - Tdap: Tetanus vaccination status reviewed: refused. - Influenza: Refused - Pneumovax: Refused - Prevnar: Refused -  HPV: Not applicable - Zostavax vaccine: Refused  SCREENING: - Colonoscopy: Refused  Discussed with patient purpose of the colonoscopy is to detect colon cancer at curable precancerous or early stages   PATIENT COUNSELING:    Sexuality: Discussed sexually transmitted diseases, partner selection, use of condoms, avoidance of unintended pregnancy  and contraceptive alternatives.   Advised to avoid cigarette smoking.  I discussed with the patient that most people either abstain from alcohol or drink within safe limits (<=14/week and <=4 drinks/occasion for males, <=7/weeks and <= 3 drinks/occasion for females) and that the risk for alcohol  disorders and other health effects rises proportionally with the number of drinks per week and how often a drinker exceeds daily limits.  Discussed cessation/primary prevention of drug use and availability of treatment for abuse.   Diet: Encouraged to adjust caloric intake to maintain  or achieve ideal body weight, to reduce intake of dietary saturated fat and total fat, to limit sodium intake by avoiding high sodium foods and not adding table salt, and to maintain adequate dietary potassium and calcium preferably from fresh fruits, vegetables, and low-fat dairy products.    stressed the importance of regular exercise  Injury prevention: Discussed safety belts, safety helmets, smoke detector, smoking near bedding or upholstery.   Dental health: Discussed importance of regular tooth brushing, flossing, and dental visits.   Follow up plan: NEXT PREVENTATIVE PHYSICAL DUE IN 1 YEAR. Return in about 6 months (around 06/15/2020) for 6 month f/u.

## 2019-12-18 LAB — CBC WITH DIFFERENTIAL/PLATELET
Basophils Absolute: 0 10*3/uL (ref 0.0–0.2)
Basos: 1 %
EOS (ABSOLUTE): 0.2 10*3/uL (ref 0.0–0.4)
Eos: 5 %
Hematocrit: 43 % (ref 37.5–51.0)
Hemoglobin: 15.2 g/dL (ref 13.0–17.7)
Immature Grans (Abs): 0 10*3/uL (ref 0.0–0.1)
Immature Granulocytes: 0 %
Lymphocytes Absolute: 1 10*3/uL (ref 0.7–3.1)
Lymphs: 25 %
MCH: 30.2 pg (ref 26.6–33.0)
MCHC: 35.3 g/dL (ref 31.5–35.7)
MCV: 86 fL (ref 79–97)
Monocytes Absolute: 0.4 10*3/uL (ref 0.1–0.9)
Monocytes: 11 %
Neutrophils Absolute: 2.3 10*3/uL (ref 1.4–7.0)
Neutrophils: 58 %
Platelets: 184 10*3/uL (ref 150–450)
RBC: 5.03 x10E6/uL (ref 4.14–5.80)
RDW: 13.5 % (ref 11.6–15.4)
WBC: 4 10*3/uL (ref 3.4–10.8)

## 2019-12-18 LAB — COMPREHENSIVE METABOLIC PANEL
ALT: 23 IU/L (ref 0–44)
AST: 31 IU/L (ref 0–40)
Albumin/Globulin Ratio: 1.3 (ref 1.2–2.2)
Albumin: 4.3 g/dL (ref 3.8–4.8)
Alkaline Phosphatase: 158 IU/L — ABNORMAL HIGH (ref 39–117)
BUN/Creatinine Ratio: 9 — ABNORMAL LOW (ref 10–24)
BUN: 11 mg/dL (ref 8–27)
Bilirubin Total: 0.3 mg/dL (ref 0.0–1.2)
CO2: 20 mmol/L (ref 20–29)
Calcium: 9 mg/dL (ref 8.6–10.2)
Chloride: 100 mmol/L (ref 96–106)
Creatinine, Ser: 1.21 mg/dL (ref 0.76–1.27)
GFR calc Af Amer: 71 mL/min/{1.73_m2} (ref >59)
GFR calc non Af Amer: 61 mL/min/{1.73_m2} (ref >59)
Globulin, Total: 3.4 g/dL (ref 1.5–4.5)
Glucose: 96 mg/dL (ref 65–99)
Potassium: 5.2 mmol/L (ref 3.5–5.2)
Sodium: 136 mmol/L (ref 134–144)
Total Protein: 7.7 g/dL (ref 6.0–8.5)

## 2019-12-18 LAB — LIPID PANEL W/O CHOL/HDL RATIO
Cholesterol, Total: 251 mg/dL — ABNORMAL HIGH (ref 100–199)
HDL: 66 mg/dL (ref >39)
LDL Chol Calc (NIH): 166 mg/dL — ABNORMAL HIGH (ref 0–99)
Triglycerides: 110 mg/dL (ref 0–149)
VLDL Cholesterol Cal: 19 mg/dL (ref 5–40)

## 2019-12-23 NOTE — Chronic Care Management (AMB) (Signed)
  Chronic Care Management   Note  12/23/2019 Name: Robert Larsen MRN: [id number] DOB: [birth year]  Jovaun Levene is a 69 y.o. year old male who is a primary care patient of Volney American, [place]. I reached out to Floy Sabina by phone today in response to a referral sent by Mr. Devlon Lehigh's health plan.     Mr. Babler was given information about Chronic Care Management services today including:  1. CCM service includes personalized support from designated clinical staff supervised by his physician, including individualized plan of care and coordination with other care providers 2. 24/7 contact phone numbers for assistance for urgent and routine care needs. 3. Service will only be billed when office clinical staff spend 20 minutes or more in a month to coordinate care. 4. Only one practitioner may furnish and bill the service in a calendar month. 5. The patient may stop CCM services at any time (effective at the end of the month) by phone call to the office staff. 6. The patient will be responsible for cost sharing (co-pay) of up to 20% of the service fee (after annual deductible is met).  Patients daughter Anderson Malta did not agree to enrollment in care management services and does not wish to consider at this time.  Follow up plan: The patient has been provided with contact information for the care management team and has been advised to call with any health related questions or concerns.   [place], [place] [address] Direct Dial: [email or web address]  Website: [email or web address]

## 2019-12-28 ENCOUNTER — Telehealth: Payer: Self-pay | Admitting: Family Medicine

## 2019-12-29 NOTE — Telephone Encounter (Signed)
Called pt yesterday afternoon to discuss lab results, left VM to return call - elevated cholesterol, would recommend starting a low dose of lipitor or crestor at this point if he is willing to try. Work on diet and activity changes as well for better control.

## 2019-12-29 NOTE — Telephone Encounter (Signed)
Patient notified. States he will talk to his daughter about starting cholesterol medication and let us know if he wants to.

## 2020-04-22 ENCOUNTER — Emergency Department
Admission: EM | Admit: 2020-04-22 | Discharge: 2020-04-22 | Disposition: A | Discharge status: Home / Self Care | Payer: Medicare Other | Attending: Emergency Medicine | Admitting: Emergency Medicine

## 2020-04-22 ENCOUNTER — Emergency Department: Payer: Medicare Other

## 2020-04-22 ENCOUNTER — Emergency Department
Admission: EM | Admit: 2020-04-22 | Discharge: 2020-04-22 | Disposition: A | Discharge status: Other Acute Inpatient Hospital | Payer: Medicare Other | Source: Home / Self Care | Attending: Emergency Medicine | Admitting: Emergency Medicine

## 2020-04-22 DIAGNOSIS — S82251C Displaced comminuted fracture of shaft of right tibia, initial encounter for open fracture type IIIA, IIIB, or IIIC: Secondary | ICD-10-CM

## 2020-04-22 DIAGNOSIS — T1490XA Injury, unspecified, initial encounter: Secondary | ICD-10-CM

## 2020-04-22 DIAGNOSIS — Y999 Unspecified external cause status: Secondary | ICD-10-CM | POA: Insufficient documentation

## 2020-04-22 DIAGNOSIS — S2241XA Multiple fractures of ribs, right side, initial encounter for closed fracture: Secondary | ICD-10-CM

## 2020-04-22 DIAGNOSIS — S329XXA Fracture of unspecified parts of lumbosacral spine and pelvis, initial encounter for closed fracture: Secondary | ICD-10-CM | POA: Insufficient documentation

## 2020-04-22 DIAGNOSIS — Y929 Unspecified place or not applicable: Secondary | ICD-10-CM | POA: Insufficient documentation

## 2020-04-22 DIAGNOSIS — R402431 Glasgow coma scale score 3-8, in the field [EMT or ambulance]: Secondary | ICD-10-CM | POA: Insufficient documentation

## 2020-04-22 DIAGNOSIS — S4291XA Fracture of right shoulder girdle, part unspecified, initial encounter for closed fracture: Secondary | ICD-10-CM | POA: Diagnosis not present

## 2020-04-22 DIAGNOSIS — Z23 Encounter for immunization: Secondary | ICD-10-CM | POA: Diagnosis not present

## 2020-04-22 DIAGNOSIS — R0689 Other abnormalities of breathing: Secondary | ICD-10-CM | POA: Insufficient documentation

## 2020-04-22 DIAGNOSIS — S51811A Laceration without foreign body of right forearm, initial encounter: Secondary | ICD-10-CM | POA: Diagnosis not present

## 2020-04-22 DIAGNOSIS — R404 Transient alteration of awareness: Secondary | ICD-10-CM | POA: Diagnosis not present

## 2020-04-22 DIAGNOSIS — T17920A Food in respiratory tract, part unspecified causing asphyxiation, initial encounter: Secondary | ICD-10-CM | POA: Diagnosis not present

## 2020-04-22 DIAGNOSIS — S82251B Displaced comminuted fracture of shaft of right tibia, initial encounter for open fracture type I or II: Secondary | ICD-10-CM | POA: Diagnosis not present

## 2020-04-22 DIAGNOSIS — Y939 Activity, unspecified: Secondary | ICD-10-CM | POA: Insufficient documentation

## 2020-04-22 DIAGNOSIS — S43014A Anterior dislocation of right humerus, initial encounter: Secondary | ICD-10-CM | POA: Insufficient documentation

## 2020-04-22 DIAGNOSIS — I959 Hypotension, unspecified: Secondary | ICD-10-CM | POA: Diagnosis not present

## 2020-04-22 DIAGNOSIS — Z743 Need for continuous supervision: Secondary | ICD-10-CM | POA: Diagnosis not present

## 2020-04-22 DIAGNOSIS — R40243 Glasgow coma scale score 3-8, unspecified time: Secondary | ICD-10-CM

## 2020-04-22 DIAGNOSIS — S32501A Unspecified fracture of right pubis, initial encounter for closed fracture: Secondary | ICD-10-CM | POA: Diagnosis not present

## 2020-04-22 DIAGNOSIS — T07XXXA Unspecified multiple injuries, initial encounter: Secondary | ICD-10-CM | POA: Diagnosis present

## 2020-04-22 LAB — PREPARE RBC (CROSSMATCH)

## 2020-04-22 MED ORDER — TRANEXAMIC ACID 1000 MG/10ML IV SOLN
1000.0000 mg | Freq: Once | INTRAVENOUS | Status: DC
  Filled 2020-04-22: qty 10

## 2020-04-22 MED ORDER — SODIUM CHLORIDE 0.9 % IV SOLN
INTRAVENOUS | Status: DC | PRN
  Administered 2020-04-22: 2000 mL via INTRAVENOUS

## 2020-04-22 MED ORDER — ROCURONIUM BROMIDE 50 MG/5ML IV SOLN
INTRAVENOUS | Status: DC | PRN
  Administered 2020-04-22: 70 mg via INTRAVENOUS

## 2020-04-22 MED ORDER — TETANUS-DIPHTH-ACELL PERTUSSIS 5-2.5-18.5 LF-MCG/0.5 IM SUSP
0.5000 mL | Freq: Once | INTRAMUSCULAR | Status: AC
  Administered 2020-04-22: 0.5 mL via INTRAMUSCULAR
  Filled 2020-04-22: qty 0.5

## 2020-04-22 MED ORDER — TRANEXAMIC ACID-NACL 1000-0.7 MG/100ML-% IV SOLN
1000.0000 mg | Freq: Once | INTRAVENOUS | Status: AC
  Administered 2020-04-22: 1000 mg via INTRAVENOUS
  Filled 2020-04-22: qty 100

## 2020-04-22 MED ORDER — SODIUM CHLORIDE 0.9 % IV SOLN: 10.0000 mL/h | Freq: Once | INTRAVENOUS | Status: DC

## 2020-04-22 MED ORDER — CEFAZOLIN SODIUM-DEXTROSE 1-4 GM/50ML-% IV SOLN
1.0000 g | Freq: Once | INTRAVENOUS | Status: AC
  Administered 2020-04-22: 1 g via INTRAVENOUS
  Filled 2020-04-22: qty 50

## 2020-04-22 MED ORDER — SUCCINYLCHOLINE CHLORIDE 20 MG/ML IJ SOLN
INTRAMUSCULAR | Status: DC | PRN
  Administered 2020-04-22: 100 mg via INTRAVENOUS

## 2020-04-22 MED ORDER — ETOMIDATE 2 MG/ML IV SOLN
INTRAVENOUS | Status: DC | PRN
  Administered 2020-04-22: 20 mg via INTRAVENOUS

## 2020-04-22 NOTE — ED Notes (Signed)
XRAY  POWERSHARE  WITH  [facility] 
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
## 2020-04-22 NOTE — ED Notes (Signed)
Pt intubated by edp williams. 7.5 cm ettube, 23 cm at lip.
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
## 2020-04-22 NOTE — ED Notes (Signed)
[facility]SF[facility]  CALLED  PER  DR  ROBINSON  MD 

## 2020-04-22 NOTE — ED Notes (Signed)
[facility]  With  [facility]  hospital

## 2020-04-22 NOTE — ED Notes (Signed)
[facility]SF[facility]  CALLED  PER  DR  ROBINSON  MD 

## 2020-05-10 DEATH — deceased

## 2020-06-15 ENCOUNTER — Ambulatory Visit: Payer: Self-pay | Admitting: Family Medicine

## 2023-02-09 ENCOUNTER — Other Ambulatory Visit (HOSPITAL_COMMUNITY): Payer: Self-pay
